# Patient Record
Sex: Female | Born: 1955 | Race: White | Hispanic: No | Marital: Married | Smoking: Never smoker
Health system: Southern US, Community
[De-identification: ages and names within clinical notes are randomized; demographics above are authoritative.]

## PROBLEM LIST (undated history)

## (undated) DIAGNOSIS — I10 Essential (primary) hypertension: Secondary | ICD-10-CM

## (undated) DIAGNOSIS — B029 Zoster without complications: Secondary | ICD-10-CM

## (undated) DIAGNOSIS — E039 Hypothyroidism, unspecified: Secondary | ICD-10-CM

## (undated) DIAGNOSIS — T7840XA Allergy, unspecified, initial encounter: Secondary | ICD-10-CM

## (undated) HISTORY — PX: APPENDECTOMY: SHX54

## (undated) HISTORY — DX: Hypothyroidism, unspecified: E03.9

## (undated) HISTORY — DX: Zoster without complications: B02.9

## (undated) HISTORY — DX: Allergy, unspecified, initial encounter: T78.40XA

## (undated) HISTORY — DX: Essential (primary) hypertension: I10

---

## 2008-06-15 HISTORY — PX: APPENDECTOMY: SHX54

## 2013-01-03 LAB — HM COLONOSCOPY

## 2013-11-13 DIAGNOSIS — B029 Zoster without complications: Secondary | ICD-10-CM

## 2013-11-13 HISTORY — DX: Zoster without complications: B02.9

## 2014-06-19 ENCOUNTER — Ambulatory Visit (INDEPENDENT_AMBULATORY_CARE_PROVIDER_SITE_OTHER): Payer: 59 | Admitting: Family Medicine

## 2014-06-19 ENCOUNTER — Encounter: Payer: Self-pay | Admitting: Family Medicine

## 2014-06-19 VITALS — BP 122/71 | HR 65 | Ht 69.0 in | Wt 167.0 lb

## 2014-06-19 DIAGNOSIS — E039 Hypothyroidism, unspecified: Secondary | ICD-10-CM | POA: Insufficient documentation

## 2014-06-19 DIAGNOSIS — E038 Other specified hypothyroidism: Secondary | ICD-10-CM

## 2014-06-19 DIAGNOSIS — N898 Other specified noninflammatory disorders of vagina: Secondary | ICD-10-CM

## 2014-06-19 HISTORY — DX: Hypothyroidism, unspecified: E03.9

## 2014-06-19 NOTE — Progress Notes (Signed)
CC: Danielle SchwartzKathryn Galvan is a 59 y.o. female is here for Establish Care   Subjective: HPI:  Very pleasant 59 year old here to establish care  Patient tells me that her OB/GYN in late 2015 did a random TSH and it was mildly elevated. Chart review shows that this was 7. On direct questioning she admits to long-standing cold intolerance, constipation and skin dryness. She denies any other skin or hair changes. She reports unintentional weight loss but admits it was over the holidays. Her aunt has a history of a thyroidectomy but she cannot give any other thyroid disease in the family. She's never worked around radiation nor does she report any dysphagia or swelling of the neck. TSH has not been repeated she was directed here by her  OB/GYN.  She tells me that she's been suffering from vaginal dryness and painful intercourse for at least one year now. She reports having sex approximately 3 times a year due to the discomfort. She's tried multiple over-the-counter lubricants and gels without much benefit. She was prescribed Premarin however was too expensive to get filled. She still has her uterus and has not had a period in 10 years. She tells me she would like to have sex more often if there was a treatment for her discomfort.  Review of Systems - General ROS: negative for - chills, fever, night sweats,  weight loss Ophthalmic ROS: negative for - decreased vision Psychological ROS: negative for - anxiety or depression ENT ROS: negative for - hearing change, nasal congestion, tinnitus or allergies Hematological and Lymphatic ROS: negative for - bleeding problems, bruising or swollen lymph nodes Breast ROS: negative Respiratory ROS: no cough, shortness of breath, or wheezing Cardiovascular ROS: no chest pain or dyspnea on exertion Gastrointestinal ROS: no abdominal pain, change in bowel habits, or black or bloody stools Genito-Urinary ROS: negative for - genital discharge, genital ulcers, incontinence or  abnormal bleeding from genitals Musculoskeletal ROS: negative for - joint pain or muscle pain Neurological ROS: negative for - headaches or memory loss Dermatological ROS: negative for lumps, mole changes, rash and skin lesion changes  Past Medical History  Diagnosis Date  . Shingles 11/2013    History reviewed. No pertinent past surgical history. Family History  Problem Relation Age of Onset  . Prostate cancer Father   . Dementia Father     History   Social History  . Marital Status: Married    Spouse Name: N/A    Number of Children: N/A  . Years of Education: N/A   Occupational History  . Not on file.   Social History Main Topics  . Smoking status: Never Smoker   . Smokeless tobacco: Not on file  . Alcohol Use: No  . Drug Use: No  . Sexual Activity:    Partners: Male   Other Topics Concern  . Not on file   Social History Narrative  . No narrative on file     Objective: BP 122/71 mmHg  Pulse 65  Ht 5\' 9"  (1.753 m)  Wt 167 lb (75.751 kg)  BMI 24.65 kg/m2  General: Alert and Oriented, No Acute Distress HEENT: Pupils equal, round, reactive to light. Conjunctivae clear.   Moist mucous membranes, pharynx without inflammation nor lesions.  Neck supple without palpable lymphadenopathy nor abnormal masses. Lungs: Clear to auscultation bilaterally, no wheezing/ronchi/rales.  Comfortable work of breathing. Good air movement. Cardiac: Regular rate and rhythm. Normal S1/S2.  No murmurs, rubs, nor gallops.   Extremities: No peripheral edema.  Strong peripheral  pulses.  Mental Status: No depression, anxiety, nor agitation. Skin: Warm and dry.  Assessment & Plan: Danielle Galvan was seen today for establish care.  Diagnoses and associated orders for this visit:  Subclinical hypothyroidism - TSH - T4, free - T3, free  Vaginal dryness    Subclinical hyperthyroidism: Rechecking TSH and free thyroid hormones. Ultimate plan of treatment will be based on any abnormality  above. Vaginal dryness: Will determine if hypothyroidism is contributing to this to any degree, if she continues to have hypothyroidism will only initiate treatment directed at that and see if TSH correction also helps with vaginal secretions. If the above is completely normal will offer Vagifem one tablet IV daily for two weeks followed by twice weekly.  Return if symptoms worsen or fail to improve.

## 2014-06-20 ENCOUNTER — Telehealth: Payer: Self-pay | Admitting: Family Medicine

## 2014-06-20 LAB — TSH: TSH: 4.996 u[IU]/mL — ABNORMAL HIGH (ref 0.350–4.500)

## 2014-06-20 LAB — T4, FREE: Free T4: 1.17 ng/dL (ref 0.80–1.80)

## 2014-06-20 LAB — T3, FREE: T3 FREE: 3 pg/mL (ref 2.3–4.2)

## 2014-06-20 MED ORDER — LEVOTHYROXINE SODIUM 25 MCG PO TABS: 25.0000 ug | ORAL_TABLET | Freq: Every day | ORAL | Status: DC

## 2014-06-20 NOTE — Telephone Encounter (Signed)
Left message on pt.'s vm.

## 2014-06-20 NOTE — Telephone Encounter (Signed)
Danielle Galvan, Will you please let patient know that her blood work confirms a persistent low thyroid function.  I'd recommend starting a thyroid supplementation regimen with levothyroxine that I've sent to her [facility].  I'd recommend she return in 1-2 months to look into improvement of many of the symptoms we discussed Tuesday.

## 2014-06-28 ENCOUNTER — Encounter: Payer: Self-pay | Admitting: Family Medicine

## 2014-06-28 DIAGNOSIS — Z299 Encounter for prophylactic measures, unspecified: Secondary | ICD-10-CM | POA: Insufficient documentation

## 2014-10-09 ENCOUNTER — Other Ambulatory Visit: Payer: Self-pay | Admitting: *Deleted

## 2014-10-09 ENCOUNTER — Ambulatory Visit: Payer: Self-pay | Admitting: Family Medicine

## 2014-10-09 MED ORDER — LEVOTHYROXINE SODIUM 25 MCG PO TABS: 25.0000 ug | ORAL_TABLET | Freq: Every day | ORAL | Status: DC

## 2014-10-17 ENCOUNTER — Encounter: Payer: Self-pay | Admitting: Family Medicine

## 2014-10-17 ENCOUNTER — Ambulatory Visit (INDEPENDENT_AMBULATORY_CARE_PROVIDER_SITE_OTHER): Payer: 59 | Admitting: Family Medicine

## 2014-10-17 VITALS — BP 131/77 | HR 74 | Ht 69.0 in | Wt 172.0 lb

## 2014-10-17 DIAGNOSIS — N898 Other specified noninflammatory disorders of vagina: Secondary | ICD-10-CM | POA: Diagnosis not present

## 2014-10-17 DIAGNOSIS — E039 Hypothyroidism, unspecified: Secondary | ICD-10-CM

## 2014-10-17 MED ORDER — ESTRADIOL 10 MCG VA TABS: ORAL_TABLET | VAGINAL | Status: DC

## 2014-10-17 NOTE — Progress Notes (Signed)
CC: Danielle SchwartzKathryn Galvan is a 59 y.o. female is here for Follow-up   Subjective: HPI:  Follow-up hypothyroidism: Continues to take 25 g of levothyroxine on a daily basis. She tells me that she's noticed improvements with no longer feeling colder than others, she is having restful sleep feels more energy in the morning, and his overall feels better since taking levothyroxine. She denies any known side effects. No gastrointestinal complaints skin or hair complaints.  She is still experiencing vaginal dryness which is interfering with her sexual relationship with her husband. She would like to have sex more often if he were so painful. She is unable to afford Premarin, she denies any other genitourinary complaints. The symptoms may going on for over a year not improved with new levothyroxine   Review Of Systems Outlined In HPI  Past Medical History  Diagnosis Date  . Shingles 11/2013    No past surgical history on file. Family History  Problem Relation Age of Onset  . Prostate cancer Father   . Dementia Father     History   Social History  . Marital Status: Married    Spouse Name: N/A  . Number of Children: N/A  . Years of Education: N/A   Occupational History  . Not on file.   Social History Main Topics  . Smoking status: Never Smoker   . Smokeless tobacco: Not on file  . Alcohol Use: No  . Drug Use: No  . Sexual Activity:    Partners: Male   Other Topics Concern  . Not on file   Social History Narrative     Objective: BP 131/77 mmHg  Pulse 74  Ht 5\' 9"  (1.753 m)  Wt 172 lb (78.019 kg)  BMI 25.39 kg/m2  Vital signs reviewed. General: Alert and Oriented, No Acute Distress HEENT: Pupils equal, round, reactive to light. Conjunctivae clear.  External ears unremarkable.  Moist mucous membranes. Lungs: Clear and comfortable work of breathing, speaking in full sentences without accessory muscle use. Cardiac: Regular rate and rhythm.  Neuro: CN II-XII grossly intact, gait  normal. Extremities: No peripheral edema.  Strong peripheral pulses.  Mental Status: No depression, anxiety, nor agitation. Logical though process. Skin: Warm and dry.  Assessment & Plan: Danielle DeistKathryn was seen today for follow-up.  Diagnoses and all orders for this visit:  Hypothyroidism, unspecified hypothyroidism type Orders: -     TSH  Vaginal dryness Orders: -     Estradiol (VAGIFEM) 10 MCG TABS vaginal tablet; one tablet intravaginally daily for two weeks followed by twice weekly   Hypothyroidism: Clinically great improvement checking TSH to ensure appropriate dosage of levothyroxine Vaginal dryness: Discussed trying Vagifem which may be much cheaper than Premarin that she is unable to afford    Return in about 3 months (around 01/17/2015).

## 2014-10-18 ENCOUNTER — Telehealth: Payer: Self-pay | Admitting: Family Medicine

## 2014-10-18 LAB — TSH: TSH: 3.917 u[IU]/mL (ref 0.350–4.500)

## 2014-10-18 MED ORDER — LEVOTHYROXINE SODIUM 25 MCG PO TABS: 25.0000 ug | ORAL_TABLET | Freq: Every day | ORAL | Status: DC

## 2014-10-18 NOTE — Telephone Encounter (Signed)
Left message on vm

## 2014-10-18 NOTE — Telephone Encounter (Signed)
Sue Lushndrea, Will you please let patient know that her thyroid supplementation appears to be adequate therefore I've send in refills of her levothyroxine to her [facility] in [place]. F/u in 3-6 months.

## 2015-01-18 ENCOUNTER — Ambulatory Visit: Payer: Self-pay | Admitting: Family Medicine

## 2015-02-20 ENCOUNTER — Ambulatory Visit (INDEPENDENT_AMBULATORY_CARE_PROVIDER_SITE_OTHER): Payer: Self-pay | Admitting: Family Medicine

## 2015-02-20 ENCOUNTER — Encounter: Payer: Self-pay | Admitting: Family Medicine

## 2015-02-20 VITALS — BP 110/68 | HR 69 | Wt 174.0 lb

## 2015-02-20 DIAGNOSIS — M5431 Sciatica, right side: Secondary | ICD-10-CM

## 2015-02-20 DIAGNOSIS — E039 Hypothyroidism, unspecified: Secondary | ICD-10-CM

## 2015-02-20 DIAGNOSIS — E038 Other specified hypothyroidism: Secondary | ICD-10-CM

## 2015-02-20 NOTE — Progress Notes (Signed)
CC: Danielle Galvan is a 59 y.o. female is here for f/u TSH   Subjective: HPI:  Follow-up hypothyroidism: She started to lose some hair on her scalp in June however started on a vitamin supplement with biotin and symptoms resided after a few days. She denies any hair loss elsewhere. There's been no unintentional weight gain or loss nor any skin complaints. No GI disturbance. Still takes levothyroxine on a daily basis.  Complains of a dull sensation in the back of her right buttock that if she is walking for long periods of time with nonsupportive footwear it will start to radiate down the back of the leg into the back of the heel. It is continuous. It will go away within a few minutes if she starts wearing supportive shoes or sits down. Pain is mild in severity but present most days of the week the last month or 2. Does not seem to be getting better or worse. She denies any weakness fasciculations or sensory disturbances in the legs other than that described above   Review Of Systems Outlined In HPI  Past Medical History  Diagnosis Date  . Shingles 11/2013    No past surgical history on file. Family History  Problem Relation Age of Onset  . Prostate cancer Father   . Dementia Father     Social History   Social History  . Marital Status: Married    Spouse Name: N/A  . Number of Children: N/A  . Years of Education: N/A   Occupational History  . Not on file.   Social History Main Topics  . Smoking status: Never Smoker   . Smokeless tobacco: Not on file  . Alcohol Use: No  . Drug Use: No  . Sexual Activity:    Partners: Male   Other Topics Concern  . Not on file   Social History Narrative     Objective: BP 110/68 mmHg  Pulse 69  Wt 174 lb (78.926 kg)  Vital signs reviewed. General: Alert and Oriented, No Acute Distress HEENT: Pupils equal, round, reactive to light. Conjunctivae clear.  External ears unremarkable.  Moist mucous membranes. Lungs: Clear and comfortable  work of breathing, speaking in full sentences without accessory muscle use. Cardiac: Regular rate and rhythm.  Neuro: CN II-XII grossly intact, gait normal. Extremities: No peripheral edema.  Strong peripheral pulses. Gait normal with full range of motion and strength of both lower extremities  Mental Status: No depression, anxiety, nor agitation. Logical though process. Skin: Warm and dry.  Assessment & Plan: Danielle Galvan was seen today for f/u tsh.  Diagnoses and all orders for this visit:  Subclinical hypothyroidism -     TSH  Sciatica, right   Subclinical hypothyroidism: Currently stable, she'll be due for a TSH in the next 1 or 2 months. Given her a lab slip to have this done at her convenience. Continue 25 MCG of levothyroxine pending results Right leg sciatica: Given a home rehabilitation exercise handouts to perform a daily basis for the next 2-3 weeks  Return if symptoms worsen or fail to improve.

## 2015-04-13 LAB — TSH: TSH: 6.371 u[IU]/mL — AB (ref 0.350–4.500)

## 2015-04-15 ENCOUNTER — Telehealth: Payer: Self-pay | Admitting: Family Medicine

## 2015-04-15 MED ORDER — LEVOTHYROXINE SODIUM 50 MCG PO TABS: 50.0000 ug | ORAL_TABLET | Freq: Every day | ORAL | Status: DC

## 2015-04-15 NOTE — Telephone Encounter (Signed)
awaiting call back

## 2015-04-15 NOTE — Telephone Encounter (Signed)
Will you please let patient know that her thyroid supplement appears to be under-dosed therefore I'd recommend increasing her supplement to a formulation that I've sent to her [facility].  I'd recommend f/u in 2 months to recheck this.

## 2015-04-23 ENCOUNTER — Ambulatory Visit (INDEPENDENT_AMBULATORY_CARE_PROVIDER_SITE_OTHER): Payer: BC Managed Care – PPO | Admitting: Family Medicine

## 2015-04-23 ENCOUNTER — Encounter: Payer: Self-pay | Admitting: Family Medicine

## 2015-04-23 VITALS — BP 112/69 | HR 73 | Wt 171.0 lb

## 2015-04-23 DIAGNOSIS — Z1231 Encounter for screening mammogram for malignant neoplasm of breast: Secondary | ICD-10-CM

## 2015-04-23 DIAGNOSIS — Z0000 Encounter for general adult medical examination without abnormal findings: Secondary | ICD-10-CM

## 2015-04-23 DIAGNOSIS — Z23 Encounter for immunization: Secondary | ICD-10-CM

## 2015-04-23 MED ORDER — CLOBETASOL PROP EMOLLIENT BASE 0.05 % EX CREA: TOPICAL_CREAM | CUTANEOUS | Status: DC

## 2015-04-23 MED ORDER — ZOSTER VACCINE LIVE 19400 UNT/0.65ML ~~LOC~~ SOLR: 0.6500 mL | Freq: Once | SUBCUTANEOUS | Status: DC

## 2015-04-23 NOTE — Addendum Note (Signed)
Addended by: Thom ChimesHENRY, Carle Fenech M on: 04/23/2015 04:20 PM   Modules accepted: Orders

## 2015-04-23 NOTE — Progress Notes (Signed)
CC: Danielle SchwartzKathryn Galvan is a 59 y.o. female is here for Annual Exam and Lab Results   Subjective: HPI:  Colonoscopy: Due for repeat 2018 Papsmear: three-year follow-up will be due next year Mammogram: it's been greater than 1 year since her last mammogram, orders have been placed  Influenza Vaccine: declines Pneumovax: no current indication Td/Tdap: 2005? Will receive booster today Zoster: urged to get prescription filled and financially affordable  Requesting complete physical exam with no acute complaints.  Review of Systems - General ROS: negative for - chills, fever, night sweats, weight gain or weight loss Ophthalmic ROS: negative for - decreased vision Psychological ROS: negative for - anxiety or depression ENT ROS: negative for - hearing change, nasal congestion, tinnitus or allergies Hematological and Lymphatic ROS: negative for - bleeding problems, bruising or swollen lymph nodes Breast ROS: negative Respiratory ROS: no cough, shortness of breath, or wheezing Cardiovascular ROS: no chest pain or dyspnea on exertion Gastrointestinal ROS: no abdominal pain, change in bowel habits, or black or bloody stools Genito-Urinary ROS: negative for - genital discharge, genital ulcers, incontinence or abnormal bleeding from genitals Musculoskeletal ROS: negative for - joint pain or muscle pain Neurological ROS: negative for - headaches or memory loss Dermatological ROS: negative for lumps, mole changes, rash and skin lesion changes  Past Medical History  Diagnosis Date  . Shingles 11/2013    No past surgical history on file. Family History  Problem Relation Age of Onset  . Prostate cancer Father   . Dementia Father     Social History   Social History  . Marital Status: Married    Spouse Name: N/A  . Number of Children: N/A  . Years of Education: N/A   Occupational History  . Not on file.   Social History Main Topics  . Smoking status: Never Smoker   . Smokeless tobacco: Not  on file  . Alcohol Use: No  . Drug Use: No  . Sexual Activity:    Partners: Male   Other Topics Concern  . Not on file   Social History Narrative     Objective: BP 112/69 mmHg  Pulse 73  Wt 171 lb (77.565 kg)  General: No Acute Distress HEENT: Atraumatic, normocephalic, conjunctivae normal without scleral icterus.  No nasal discharge, hearing grossly intact, TMs with good landmarks bilaterally with no middle ear abnormalities, posterior pharynx clear without oral lesions. Neck: Supple, trachea midline, no cervical nor supraclavicular adenopathy. Pulmonary: Clear to auscultation bilaterally without wheezing, rhonchi, nor rales. Cardiac: Regular rate and rhythm.  No murmurs, rubs, nor gallops. No peripheral edema.  2+ peripheral pulses bilaterally. Abdomen: Bowel sounds normal.  No masses.  Non-tender without rebound.  Negative Murphy's sign. MSK: Grossly intact, no signs of weakness.  Full strength throughout upper and lower extremities.  Full ROM in upper and lower extremities.  No midline spinal tenderness. Neuro: Gait unremarkable, CN II-XII grossly intact.  C5-C6 Reflex 2/4 Bilaterally, L4 Reflex 2/4 Bilaterally.  Cerebellar function intact. Skin: No rashes. Psych: Alert and oriented to person/place/time.  Thought process normal. No anxiety/depression.   Assessment & Plan: Danielle DeistKathryn was seen today for annual exam and lab results.  Diagnoses and all orders for this visit:  Annual physical exam -     zoster vaccine live, PF, (ZOSTAVAX) [id number] UNT/0.65ML injection; Inject 19,400 Units into the skin once. -     Lipid panel -     COMPLETE METABOLIC PANEL WITH GFR -     CBC  Encounter for screening  mammogram for breast cancer -     MM DIGITAL SCREENING BILATERAL; Future  Other orders -     Clobetasol Prop Emollient Base (CLOBETASOL PROPIONATE E) 0.05 % emollient cream; Apply to dermatitis twice a day as needed for up to two weeks.  Healthy lifestyle interventions including but  not limited to regular exercise, a healthy low fat diet, moderation of salt intake, the dangers of tobacco/alcohol/recreational drug use, nutrition supplementation, and accident avoidance were discussed with the patient and a handout was provided for future reference.  She brings in an expired tube of clobetasol that she uses for poison ivy, I'm giving her a prescription for fresh batch.  Return in about 3 months (around 07/24/2015) for Thyroid Check.

## 2015-04-24 ENCOUNTER — Ambulatory Visit (INDEPENDENT_AMBULATORY_CARE_PROVIDER_SITE_OTHER): Payer: BC Managed Care – PPO

## 2015-04-24 DIAGNOSIS — Z1231 Encounter for screening mammogram for malignant neoplasm of breast: Secondary | ICD-10-CM

## 2015-04-24 NOTE — Telephone Encounter (Signed)
Pt notified during office visit.

## 2015-04-26 LAB — COMPLETE METABOLIC PANEL WITH GFR
ALBUMIN: 4.2 g/dL (ref 3.6–5.1)
ALK PHOS: 62 U/L (ref 33–130)
ALT: 17 U/L (ref 6–29)
AST: 20 U/L (ref 10–35)
BILIRUBIN TOTAL: 0.6 mg/dL (ref 0.2–1.2)
BUN: 19 mg/dL (ref 7–25)
CALCIUM: 9.5 mg/dL (ref 8.6–10.4)
CO2: 28 mmol/L (ref 20–31)
Chloride: 101 mmol/L (ref 98–110)
Creat: 0.9 mg/dL (ref 0.50–0.99)
GFR, EST AFRICAN AMERICAN: 80 mL/min (ref >=60)
GFR, Est Non African American: 69 mL/min (ref >=60)
Glucose, Bld: 93 mg/dL (ref 65–99)
Potassium: 4.9 mmol/L (ref 3.5–5.3)
Sodium: 137 mmol/L (ref 135–146)
TOTAL PROTEIN: 6.9 g/dL (ref 6.1–8.1)

## 2015-04-26 LAB — CBC
HEMATOCRIT: 42.1 % (ref 36.0–46.0)
Hemoglobin: 14.1 g/dL (ref 12.0–15.0)
MCH: 29.4 pg (ref 26.0–34.0)
MCHC: 33.5 g/dL (ref 30.0–36.0)
MCV: 87.7 fL (ref 78.0–100.0)
MPV: 10.7 fL (ref 8.6–12.4)
Platelets: 255 10*3/uL (ref 150–400)
RBC: 4.8 MIL/uL (ref 3.87–5.11)
RDW: 13.5 % (ref 11.5–15.5)
WBC: 4.1 10*3/uL (ref 4.0–10.5)

## 2015-04-26 LAB — LIPID PANEL
CHOLESTEROL: 172 mg/dL (ref 125–200)
HDL: 75 mg/dL (ref >=46)
LDL Cholesterol: 85 mg/dL (ref <130)
TRIGLYCERIDES: 61 mg/dL (ref <150)
Total CHOL/HDL Ratio: 2.3 Ratio (ref <=5.0)
VLDL: 12 mg/dL (ref <30)

## 2015-08-13 ENCOUNTER — Ambulatory Visit (INDEPENDENT_AMBULATORY_CARE_PROVIDER_SITE_OTHER): Payer: BC Managed Care – PPO | Admitting: Family Medicine

## 2015-08-13 ENCOUNTER — Encounter: Payer: Self-pay | Admitting: Family Medicine

## 2015-08-13 VITALS — BP 114/74 | HR 67 | Wt 170.0 lb

## 2015-08-13 DIAGNOSIS — E039 Hypothyroidism, unspecified: Secondary | ICD-10-CM

## 2015-08-13 DIAGNOSIS — E038 Other specified hypothyroidism: Secondary | ICD-10-CM

## 2015-08-13 NOTE — Progress Notes (Signed)
CC: Danielle Galvan is a 60 y.o. female is here for Hypothyroidism   Subjective: HPI:  Follow-up hypothyroidism: She is taking levothyroxine50 MCG's without any noted side effects. She tells me that she feels great lately and she has no complaints. There's been no hair or skin complaints. She denies any unintentional weight loss or gain or any constipation or diarrhea.   Review Of Systems Outlined In HPI  Past Medical History  Diagnosis Date  . Shingles 11/2013    No past surgical history on file. Family History  Problem Relation Age of Onset  . Prostate cancer Father   . Dementia Father     Social History   Social History  . Marital Status: Married    Spouse Name: N/A  . Number of Children: N/A  . Years of Education: N/A   Occupational History  . Not on file.   Social History Main Topics  . Smoking status: Never Smoker   . Smokeless tobacco: Not on file  . Alcohol Use: No  . Drug Use: No  . Sexual Activity:    Partners: Male   Other Topics Concern  . Not on file   Social History Narrative     Objective: BP 114/74 mmHg  Pulse 67  Wt 170 lb (77.111 kg)  Vital signs reviewed. General: Alert and Oriented, No Acute Distress HEENT: Pupils equal, round, reactive to light. Conjunctivae clear.  External ears unremarkable.  Moist mucous membranes. Lungs: Clear and comfortable work of breathing, speaking in full sentences without accessory muscle use. Cardiac: Regular rate and rhythm.  Neuro: CN II-XII grossly intact, gait normal. Extremities: No peripheral edema.  Strong peripheral pulses.  Mental Status: No depression, anxiety, nor agitation. Logical though process. Skin: Warm and dry. Assessment & Plan: Danielle Galvan was seen today for hypothyroidism.  Diagnoses and all orders for this visit:  Subclinical hypothyroidism -     TSH   Asymptomatic subclinical hypothyroidism. Rechecking TSH, ultimate plan will be based on these results. If normal she can follow-up in  6 months   Return if symptoms worsen or fail to improve.

## 2015-08-14 ENCOUNTER — Telehealth: Payer: Self-pay | Admitting: Family Medicine

## 2015-08-14 LAB — TSH: TSH: 3.41 m[IU]/L

## 2015-08-14 MED ORDER — LEVOTHYROXINE SODIUM 50 MCG PO TABS: 50.0000 ug | ORAL_TABLET | Freq: Every day | ORAL | Status: DC

## 2015-08-14 NOTE — Telephone Encounter (Signed)
Will you please let patient know that her thyroid supplement appears to be adequately dosed.  I'll make sure she has refills sent to her pharmacy and would recommend rechecking this in six months.

## 2015-08-14 NOTE — Telephone Encounter (Signed)
Pt.notified

## 2016-04-20 ENCOUNTER — Telehealth: Payer: Self-pay

## 2016-04-20 MED ORDER — LEVOTHYROXINE SODIUM 50 MCG PO TABS: 50.0000 ug | ORAL_TABLET | Freq: Every day | ORAL | 0 refills | Status: DC

## 2016-04-20 NOTE — Telephone Encounter (Signed)
Patient has an upcoming appt on the 14th and she request a refill for Levothryoxine. Patient was advised that a 30 day supply be sent to her pharmacy and she understood she must keep her follow up appointment. Rhonda Cunningham,CMA

## 2016-04-28 ENCOUNTER — Ambulatory Visit (INDEPENDENT_AMBULATORY_CARE_PROVIDER_SITE_OTHER): Payer: BC Managed Care – PPO | Admitting: Osteopathic Medicine

## 2016-04-28 ENCOUNTER — Encounter: Payer: Self-pay | Admitting: Osteopathic Medicine

## 2016-04-28 VITALS — BP 125/67 | HR 66 | Ht 67.0 in | Wt 178.0 lb

## 2016-04-28 DIAGNOSIS — M25562 Pain in left knee: Secondary | ICD-10-CM

## 2016-04-28 DIAGNOSIS — N898 Other specified noninflammatory disorders of vagina: Secondary | ICD-10-CM

## 2016-04-28 DIAGNOSIS — Z0000 Encounter for general adult medical examination without abnormal findings: Secondary | ICD-10-CM | POA: Diagnosis not present

## 2016-04-28 DIAGNOSIS — L989 Disorder of the skin and subcutaneous tissue, unspecified: Secondary | ICD-10-CM | POA: Diagnosis not present

## 2016-04-28 DIAGNOSIS — E039 Hypothyroidism, unspecified: Secondary | ICD-10-CM | POA: Diagnosis not present

## 2016-04-28 NOTE — Patient Instructions (Addendum)
1500 calories per day (no less than 1450, no more than 2000)  Silicone based lubricant Call your insurance company and ask what is on the formulary for vaginal estrogen

## 2016-04-28 NOTE — Progress Notes (Signed)
HPI: Danielle Galvan is a 60 y.o. female  who presents to [facility] today, 04/28/16,  for chief complaint of:  Chief Complaint  Patient presents with  . Establish Care    switch from [place]    See below for review of preventive care.  Brief review of minor complaints as noted below.  SKIN: Spot on nose, not getting bigger, feels callused.   KNEES . Location: L knee  . Quality: Was having some pain in the left knee particularly with stairs, but this seems to have resolved . Context: Patient thinks it was due to getting out of her car and turning around too quickly, repeated stress, seemed to go away after she was spinning as a bit of time kneeling down to pain something in her house   Past medical, surgical, social and family history reviewed: Patient Active Problem List   Diagnosis Date Noted  . Preventive measure 06/28/2014  . Subclinical hypothyroidism 06/19/2014  . Vaginal dryness 06/19/2014   History reviewed. No pertinent surgical history.   Social History  Substance Use Topics  . Smoking status: Never Smoker  . Smokeless tobacco: Never Used  . Alcohol use No   Family History  Problem Relation Age of Onset  . Prostate cancer Father   . Dementia Father      Current medication list and allergy/intolerance information reviewed:   Current Outpatient Prescriptions  Medication Sig Dispense Refill  . Clobetasol Prop Emollient Base (CLOBETASOL PROPIONATE E) 0.05 % emollient cream Apply to dermatitis twice a day as needed for up to two weeks. 15 g 3  . levothyroxine (SYNTHROID) 50 MCG tablet Take 1 tablet (50 mcg total) by mouth daily before breakfast. Discontinue 42mg formulation. 30 tablet 0  . MAGNESIUM PO Take by mouth.    . Methylsulfonylmethane (MSM PO) Take by mouth.    .Marland KitchenVITAMIN D, CHOLECALCIFEROL, PO Take by mouth.    .Marland KitchenVITAMIN E PO Take by mouth.    . zoster vaccine live, PF, (ZOSTAVAX) 135701UNT/0.65ML injection Inject  19,400 Units into the skin once. 1 each 0   No current facility-administered medications for this visit.    Allergies  Allergen Reactions  . Ciprofloxacin   . Ivy Leaf [Hedera Helix]   . Sulfa Antibiotics Hives      Review of Systems:  Constitutional:  No  fever, no chills, No recent illness, No unintentional weight changes. No significant fatigue.   HEENT: No  headache, no vision change  Cardiac: No  chest pain, No  pressure, No palpitations, No  Orthopnea  Respiratory:  No  shortness of breath. No  Cough  Gastrointestinal: No  abdominal pain, No  nausea   Musculoskeletal: No new myalgia/arthralgia  Skin: No  Rash, +other wounds/concerning lesions  Hem/Onc: No  easy bruising/bleeding, No  abnormal lymph node  Neurologic: No  weakness, No  dizzines  Psychiatric: No  concerns with depression, No  concerns with anxiety, No sleep problems, No mood problems  Exam:  BP 125/67   Pulse 66   Ht _0  (1.702 m)   Wt 178 lb (80.7 kg)   BMI 27.88 kg/m   Constitutional: VS see above. General Appearance: alert, well-developed, well-nourished, NAD  Eyes: Normal lids and conjunctive, non-icteric sclera  Ears, Nose, Mouth, Throat: MMM, Normal external inspection ears/nares/mouth/lips/gums. TM normal bilaterally. Pharynx/tonsils no erythema, no exudate. Nasal mucosa normal.   Neck: No masses, trachea midline. No thyroid enlargement. No tenderness/mass appreciated. No lymphadenopathy  Respiratory:  Normal respiratory effort. no wheeze, no rhonchi, no rales  Cardiovascular: S1/S2 normal, no murmur, no rub/gallop auscultated. RRR. No lower extremity edema. Pedal pulse II/IV bilaterally DP and PT. No carotid bruit or JVD. No abdominal aortic bruit.  Gastrointestinal: Nontender, no masses. No hepatomegaly, no splenomegaly. No hernia appreciated. Bowel sounds normal. Rectal exam deferred.   Musculoskeletal: Gait normal. No clubbing/cyanosis of digits. L knee: Negative  anterior/posterior drawer, negative McMurray's to internal and sternal rotation, negative varus/valgus stress, no crepitus.  Neurological: Normal balance/coordination. No tremor. No cranial nerve deficit on limited exam. Motor and sensation intact and symmetric. Cerebellar reflexes intact.   Skin: warm, dry, intact. No rash/ulcer. No concerning nevi or subq nodules on limited exam.    Psychiatric: Normal judgment/insight. Normal mood and affect. Oriented x3.    Results for orders placed or performed in visit on 04/28/16 (from the past 72 hour(s))  CBC with Differential/Platelet     Status: None   Collection Time: 04/28/16  4:45 PM  Result Value Ref Range   WBC 4.7 3.8 - 10.8 K/uL   RBC 4.74 3.80 - 5.10 MIL/uL   Hemoglobin 13.9 11.7 - 15.5 g/dL   HCT 41.6 35.0 - 45.0 %   MCV 87.8 80.0 - 100.0 fL   MCH 29.3 27.0 - 33.0 pg   MCHC 33.4 32.0 - 36.0 g/dL   RDW 13.6 11.0 - 15.0 %   Platelets 262 140 - 400 K/uL   MPV 10.8 7.5 - 12.5 fL   Neutro Abs 2,444 1,500 - 7,800 cells/uL   Lymphs Abs 1,692 850 - 3,900 cells/uL   Monocytes Absolute 470 200 - 950 cells/uL   Eosinophils Absolute 94 15 - 500 cells/uL   Basophils Absolute 0 0 - 200 cells/uL   Neutrophils Relative % 52 %   Lymphocytes Relative 36 %   Monocytes Relative 10 %   Eosinophils Relative 2 %   Basophils Relative 0 %   Smear Review Criteria for review not met   COMPLETE METABOLIC PANEL WITH GFR     Status: None   Collection Time: 04/28/16  4:45 PM  Result Value Ref Range   Sodium 137 135 - 146 mmol/L   Potassium 4.4 3.5 - 5.3 mmol/L   Chloride 101 98 - 110 mmol/L   CO2 29 20 - 31 mmol/L   Glucose, Bld 91 65 - 99 mg/dL   BUN 20 7 - 25 mg/dL   Creat 0.96 0.50 - 0.99 mg/dL    Comment:   For patients > or = 62 years of age: The upper reference limit for Creatinine is approximately 13% higher for people identified as African-American.      Total Bilirubin 0.4 0.2 - 1.2 mg/dL   Alkaline Phosphatase 59 33 - 130 U/L   AST  21 10 - 35 U/L   ALT 18 6 - 29 U/L   Total Protein 7.4 6.1 - 8.1 g/dL   Albumin 4.5 3.6 - 5.1 g/dL   Calcium 9.3 8.6 - 10.4 mg/dL   GFR, Est African American 73 >=60 mL/min   GFR, Est Non African American 64 >=60 mL/min  Lipid panel     Status: Abnormal   Collection Time: 04/28/16  4:45 PM  Result Value Ref Range   Cholesterol 195 <200 mg/dL    Comment: ** Please note change in reference range(s). **      Triglycerides 84 <150 mg/dL    Comment: ** Please note change in reference range(s). **  HDL 74 >50 mg/dL    Comment: ** Please note change in reference range(s). **      Total CHOL/HDL Ratio 2.6 <5.0 Ratio   VLDL 17 <30 mg/dL   LDL Cholesterol 104 (H) <100 mg/dL    Comment: ** Please note change in reference range(s). **     TSH     Status: Abnormal   Collection Time: 04/28/16  4:45 PM  Result Value Ref Range   TSH 5.99 (H) mIU/L    Comment:   Reference Range   > or = 20 Years  0.40-4.50   Pregnancy Range First trimester  0.26-2.66 Second trimester 0.55-2.73 Third trimester  0.43-2.91         ASSESSMENT/PLAN:   Annual physical exam - Plan: CBC with Differential/Platelet, COMPLETE METABOLIC PANEL WITH GFR, Lipid panel, TSH  Hypothyroidism, unspecified type  Left knee pain, unspecified chronicity - Seems to have resolved, patient is more curious about why it would have gone away on its own. I advised if it recurs, let me know  Benign skin lesion of nose - Appears to be actinic change but I would not feel comfortable biopsying this area or freezing it without confirming wit, advise follow-up with her dermatologist  Vaginal dryness    Patient Instructions  1500 calories per day (no less than 1450, no more than 2355)  Silicone based lubricant Call your insurance company and ask what is on the formulary for vaginal estrogen        FEMALE PREVENTIVE CARE Updated 04/28/16   ANNUAL SCREENING/COUNSELING  Diet/Exercise - HEALTHY HABITS DISCUSSED  TO DECREASE CV RISK History  Smoking Status  . Never Smoker  Smokeless Tobacco  . Never Used   History  Alcohol Use No    No flowsheet data found.  Domestic violence concerns - no  HTN SCREENING - SEE [place]  Sexually active in the past year - Yes with female.  Need/want STI testing today? - no  Concerns about libido or pain with sex? - yes - pain/dryness  INFECTIOUS DISEASE SCREENING  HIV - does not need  GC/CT - does not need  HepC - DOB [birth year] - does not need  TB - does not need  DISEASE SCREENING  Lipid - needs  DM2 - needs  Osteoporosis - women age 18+ - does not need  CANCER SCREENING  Cervical - does not need - following with OBGYN   Breast - does not need - every other year   Lung - does not need  Colon - does not need - not due until 2018   ADULT VACCINATION  Influenza - annual vaccine recommended  Td - booster every 10 years   Zoster - option at 50, yes at 60+   PCV13 - was not indicated  PPSV23 - was not indicated Immunization History  Administered Date(s) Administered  . Hepatitis B 04/22/2012, 08/17/2013  . Influenza-Unspecified 04/23/2015  . MMR 02/02/1994  . Td 01/17/2004  . Tdap 04/23/2015    OTHER  Fall - exercise and Vit D age 28+ - does not need  Consider ASA - age 61-59 - does not need     Visit summary with medication list and pertinent instructions was printed for patient to review. All questions at time of visit were answered - patient instructed to contact office with any additional concerns. ER/RTC precautions were reviewed with the patient. Follow-up plan: Return in about 1 year (around 04/28/2017) for [place] sooner if needed .

## 2016-04-29 LAB — COMPLETE METABOLIC PANEL WITH GFR
ALT: 18 U/L (ref 6–29)
AST: 21 U/L (ref 10–35)
Albumin: 4.5 g/dL (ref 3.6–5.1)
Alkaline Phosphatase: 59 U/L (ref 33–130)
BILIRUBIN TOTAL: 0.4 mg/dL (ref 0.2–1.2)
BUN: 20 mg/dL (ref 7–25)
CHLORIDE: 101 mmol/L (ref 98–110)
CO2: 29 mmol/L (ref 20–31)
Calcium: 9.3 mg/dL (ref 8.6–10.4)
Creat: 0.96 mg/dL (ref 0.50–0.99)
GFR, EST AFRICAN AMERICAN: 73 mL/min (ref >=60)
GFR, EST NON AFRICAN AMERICAN: 64 mL/min (ref >=60)
GLUCOSE: 91 mg/dL (ref 65–99)
POTASSIUM: 4.4 mmol/L (ref 3.5–5.3)
SODIUM: 137 mmol/L (ref 135–146)
TOTAL PROTEIN: 7.4 g/dL (ref 6.1–8.1)

## 2016-04-29 LAB — CBC WITH DIFFERENTIAL/PLATELET
BASOS ABS: 0 {cells}/uL (ref 0–200)
Basophils Relative: 0 %
EOS ABS: 94 {cells}/uL (ref 15–500)
EOS PCT: 2 %
HCT: 41.6 % (ref 35.0–45.0)
Hemoglobin: 13.9 g/dL (ref 11.7–15.5)
LYMPHS PCT: 36 %
Lymphs Abs: 1692 cells/uL (ref 850–3900)
MCH: 29.3 pg (ref 27.0–33.0)
MCHC: 33.4 g/dL (ref 32.0–36.0)
MCV: 87.8 fL (ref 80.0–100.0)
MPV: 10.8 fL (ref 7.5–12.5)
Monocytes Absolute: 470 cells/uL (ref 200–950)
Monocytes Relative: 10 %
NEUTROS ABS: 2444 {cells}/uL (ref 1500–7800)
NEUTROS PCT: 52 %
Platelets: 262 10*3/uL (ref 140–400)
RBC: 4.74 MIL/uL (ref 3.80–5.10)
RDW: 13.6 % (ref 11.0–15.0)
WBC: 4.7 10*3/uL (ref 3.8–10.8)

## 2016-04-29 LAB — LIPID PANEL
CHOL/HDL RATIO: 2.6 ratio (ref <5.0)
Cholesterol: 195 mg/dL (ref <200)
HDL: 74 mg/dL (ref >50)
LDL CALC: 104 mg/dL — AB (ref <100)
TRIGLYCERIDES: 84 mg/dL (ref <150)
VLDL: 17 mg/dL (ref <30)

## 2016-04-29 LAB — TSH: TSH: 5.99 mIU/L — ABNORMAL HIGH

## 2016-04-30 DIAGNOSIS — M25562 Pain in left knee: Secondary | ICD-10-CM | POA: Insufficient documentation

## 2016-04-30 DIAGNOSIS — L989 Disorder of the skin and subcutaneous tissue, unspecified: Secondary | ICD-10-CM | POA: Insufficient documentation

## 2016-04-30 MED ORDER — LEVOTHYROXINE SODIUM 75 MCG PO TABS: 75.0000 ug | ORAL_TABLET | Freq: Every day | ORAL | 0 refills | Status: DC

## 2016-04-30 NOTE — Addendum Note (Signed)
Addended by: Deirdre PippinsALEXANDER, Gavriela Cashin M on: 04/30/2016 11:47 AM   Modules accepted: Orders

## 2016-05-22 ENCOUNTER — Other Ambulatory Visit: Payer: Self-pay | Admitting: *Deleted

## 2016-05-22 ENCOUNTER — Other Ambulatory Visit: Payer: Self-pay

## 2016-05-22 MED ORDER — LEVOTHYROXINE SODIUM 75 MCG PO TABS: 75.0000 ug | ORAL_TABLET | Freq: Every day | ORAL | 0 refills | Status: DC

## 2016-07-15 ENCOUNTER — Encounter: Payer: Self-pay | Admitting: Osteopathic Medicine

## 2016-07-15 ENCOUNTER — Ambulatory Visit (INDEPENDENT_AMBULATORY_CARE_PROVIDER_SITE_OTHER): Payer: BC Managed Care – PPO | Admitting: Osteopathic Medicine

## 2016-07-15 VITALS — BP 114/66 | HR 68 | Ht 66.75 in | Wt 177.1 lb

## 2016-07-15 DIAGNOSIS — Z23 Encounter for immunization: Secondary | ICD-10-CM

## 2016-07-15 DIAGNOSIS — E039 Hypothyroidism, unspecified: Secondary | ICD-10-CM

## 2016-07-15 LAB — TSH: TSH: 1.37 m[IU]/L

## 2016-07-15 MED ORDER — ZOSTER VAC RECOMB ADJUVANTED 50 MCG/0.5ML IM SUSR: 0.5000 mL | Freq: Once | INTRAMUSCULAR | 1 refills | Status: AC

## 2016-07-15 NOTE — Progress Notes (Signed)
Patient did not need follow-up with me, needed to go to the lab, appointment was made in error. She did get flu shot so will charge nurse visit - see nurse notes. Rx for shingles vaccine was sent to her pharmacy.

## 2016-07-16 ENCOUNTER — Other Ambulatory Visit: Payer: Self-pay | Admitting: Osteopathic Medicine

## 2016-07-16 MED ORDER — LEVOTHYROXINE SODIUM 75 MCG PO TABS: 75.0000 ug | ORAL_TABLET | Freq: Every day | ORAL | 3 refills | Status: DC

## 2016-09-21 ENCOUNTER — Ambulatory Visit (INDEPENDENT_AMBULATORY_CARE_PROVIDER_SITE_OTHER): Payer: BC Managed Care – PPO | Admitting: Podiatry

## 2016-09-21 ENCOUNTER — Encounter: Payer: Self-pay | Admitting: Podiatry

## 2016-09-21 VITALS — BP 113/94 | HR 72 | Ht 69.0 in | Wt 179.0 lb

## 2016-09-21 DIAGNOSIS — M216X2 Other acquired deformities of left foot: Secondary | ICD-10-CM

## 2016-09-21 DIAGNOSIS — M2012 Hallux valgus (acquired), left foot: Secondary | ICD-10-CM

## 2016-09-21 DIAGNOSIS — M2011 Hallux valgus (acquired), right foot: Secondary | ICD-10-CM | POA: Diagnosis not present

## 2016-09-21 DIAGNOSIS — M79672 Pain in left foot: Secondary | ICD-10-CM

## 2016-09-21 DIAGNOSIS — M79671 Pain in right foot: Secondary | ICD-10-CM

## 2016-09-21 DIAGNOSIS — M216X1 Other acquired deformities of right foot: Secondary | ICD-10-CM | POA: Diagnosis not present

## 2016-09-21 DIAGNOSIS — M21619 Bunion of unspecified foot: Secondary | ICD-10-CM

## 2016-09-21 DIAGNOSIS — M21969 Unspecified acquired deformity of unspecified lower leg: Secondary | ICD-10-CM

## 2016-09-21 NOTE — Patient Instructions (Signed)
Seen for painful bunions. Noted of weak first metatarsal bone with enlarged bunions. Both feet casted for orthotics. Calluses trimmed on both great toes. Will call when orthotics are ready.

## 2016-09-21 NOTE — Progress Notes (Signed)
SUBJECTIVE: 61 y.o. year old female presents complaining of painful bunion R>L. 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  REVIEW OF SYSTEMS: Pertinent items noted in HPI and remainder of comprehensive ROS otherwise negative.  OBJECTIVE: DERMATOLOGIC EXAMINATION: Nails: normal appearing nails bilaterally Calluses: Pinch calluses on both great toe plantar medial.   VASCULAR EXAMINATION OF LOWER LIMBS: All pedal pulses are palpable with normal pulsation.  Capillary Filling times within 3 seconds in all digits.  No edema or erythema noted. Temperature gradient from tibial crest to dorsum of foot is within normal bilateral.  NEUROLOGIC EXAMINATION OF THE LOWER LIMBS: All epicritic and tactile sensations grossly intact. Sharp and Dull discriminatory sensations at the plantar ball of hallux is intact bilateral.   MUSCULOSKELETAL EXAMINATION: Positive for hypermobile first ray bilateral. Enlarged bunion L>R, more pain in right bunion.  ASSESSMENT: Painful bunion bilateral. Hypermobile first ray bilateral. Pinch callus both great toes.  PLAN: Reviewed clinical findings and available treatment options, NSAIA, injection, orthotics, surgery. Both feet casted for Orthotics.

## 2016-10-09 ENCOUNTER — Ambulatory Visit: Payer: BC Managed Care – PPO | Admitting: Osteopathic Medicine

## 2016-11-11 ENCOUNTER — Encounter: Payer: Self-pay | Admitting: Podiatry

## 2016-11-11 ENCOUNTER — Ambulatory Visit (INDEPENDENT_AMBULATORY_CARE_PROVIDER_SITE_OTHER): Payer: BC Managed Care – PPO | Admitting: Podiatry

## 2016-11-11 DIAGNOSIS — M21619 Bunion of unspecified foot: Secondary | ICD-10-CM

## 2016-11-11 DIAGNOSIS — M79671 Pain in right foot: Secondary | ICD-10-CM

## 2016-11-11 DIAGNOSIS — M21969 Unspecified acquired deformity of unspecified lower leg: Secondary | ICD-10-CM

## 2016-11-11 DIAGNOSIS — M79672 Pain in left foot: Secondary | ICD-10-CM

## 2016-11-11 NOTE — Progress Notes (Signed)
SUBJECTIVE: 61 y.o. year old female presents for follow up on orthotics. Feet are doing better with orthotics. Able to do more activities. Wearing well supported tennis shoes.  HPI: Painful bunion R>L. 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  OBJECTIVE: No changes since last visit.  ASSESSMENT: Improved foot pain with custom orthotics.  Painful bunion bilateral. Hypermobile first ray bilateral. Pinch callus both great toes.  PLAN: Reviewed shoe selection.  Return as needed.

## 2016-11-11 NOTE — Patient Instructions (Signed)
Orthotic follow up. Doing well. Return as needed. 

## 2017-02-18 ENCOUNTER — Encounter: Payer: Self-pay | Admitting: Osteopathic Medicine

## 2017-02-24 ENCOUNTER — Encounter: Payer: Self-pay | Admitting: Osteopathic Medicine

## 2017-02-24 ENCOUNTER — Ambulatory Visit (INDEPENDENT_AMBULATORY_CARE_PROVIDER_SITE_OTHER): Payer: BC Managed Care – PPO | Admitting: Osteopathic Medicine

## 2017-02-24 ENCOUNTER — Other Ambulatory Visit (HOSPITAL_COMMUNITY)
Admission: RE | Admit: 2017-02-24 | Discharge: 2017-02-24 | Disposition: A | Discharge status: Home / Self Care | Payer: BC Managed Care – PPO | Source: Ambulatory Visit | Attending: Osteopathic Medicine | Admitting: Osteopathic Medicine

## 2017-02-24 VITALS — BP 129/68 | HR 67 | Ht 69.0 in | Wt 182.0 lb

## 2017-02-24 DIAGNOSIS — Z0000 Encounter for general adult medical examination without abnormal findings: Secondary | ICD-10-CM | POA: Diagnosis not present

## 2017-02-24 DIAGNOSIS — E039 Hypothyroidism, unspecified: Secondary | ICD-10-CM | POA: Diagnosis not present

## 2017-02-24 DIAGNOSIS — D72819 Decreased white blood cell count, unspecified: Secondary | ICD-10-CM | POA: Diagnosis not present

## 2017-02-24 DIAGNOSIS — R7989 Other specified abnormal findings of blood chemistry: Secondary | ICD-10-CM | POA: Diagnosis not present

## 2017-02-24 NOTE — Progress Notes (Addendum)
HPI: Danielle Galvan is a 61 y.o. female  who presents to [facility] today, 02/24/17,  for chief complaint of:  Chief Complaint  Patient presents with  . Annual Exam    See below for review of preventive care. Doing well since last visit.   Recently seen at dermatology for destruction of benign skin lesions.   Vaginal dryness but it doesn't bother her too much.     Past medical, surgical, social and family history reviewed: Patient Active Problem List   Diagnosis Date Noted  . Left knee pain 04/30/2016  . Benign skin lesion of nose 04/30/2016  . Preventive measure 06/28/2014  . Subclinical hypothyroidism 06/19/2014  . Vaginal dryness 06/19/2014   No past surgical history on file.   Social History  Substance Use Topics  . Smoking status: Never Smoker  . Smokeless tobacco: Never Used  . Alcohol use No   Family History  Problem Relation Age of Onset  . Prostate cancer Father   . Dementia Father      Current medication list and allergy/intolerance information reviewed:   Current Outpatient Prescriptions  Medication Sig Dispense Refill  . Clobetasol Prop Emollient Base (CLOBETASOL PROPIONATE E) 0.05 % emollient cream Apply to dermatitis twice a day as needed for up to two weeks. 15 g 3  . levothyroxine (SYNTHROID, LEVOTHROID) 75 MCG tablet Take 1 tablet (75 mcg total) by mouth daily before breakfast. 90 tablet 3  . zoster vaccine live, PF, (ZOSTAVAX) 16109 UNT/0.65ML injection Inject 19,400 Units into the skin once. 1 each 0   No current facility-administered medications for this visit.    Allergies  Allergen Reactions  . Ciprofloxacin   . Ivy Leaf [Hedera Helix]   . Sulfa Antibiotics Hives      Review of Systems:  Constitutional:  No  fever, no chills, No recent illness, No unintentional weight changes. No significant fatigue.   HEENT: No  headache, no vision change  Cardiac: No  chest pain, No  pressure, No palpitations, No   Orthopnea  Respiratory:  No  shortness of breath. No  Cough  Gastrointestinal: No  abdominal pain, No  nausea   Musculoskeletal: No new myalgia/arthralgia  Skin: No  Rash, No other wounds/concerning lesions  Hem/Onc: No  easy bruising/bleeding, No  abnormal lymph node  Neurologic: No  weakness, No  dizzines  Psychiatric: No  concerns with depression, No  concerns with anxiety, No sleep problems, No mood problems  Exam:  BP 129/68   Pulse 67   Ht 5' 9" (1.753 m)   Wt 182 lb (82.6 kg)   BMI 26.88 kg/m   Constitutional: VS see above. General Appearance: alert, well-developed, well-nourished, NAD  Eyes: Normal lids and conjunctive, non-icteric sclera  Ears, Nose, Mouth, Throat: MMM, Normal external inspection ears/nares/mouth/lips/gums. TM normal bilaterally. Pharynx/tonsils no erythema, no exudate. Nasal mucosa normal.   Neck: No masses, trachea midline. No thyroid enlargement. No tenderness/mass appreciated. No lymphadenopathy  Respiratory: Normal respiratory effort. no wheeze, no rhonchi, no rales  Cardiovascular: S1/S2 normal, no murmur, no rub/gallop auscultated. RRR. No lower extremity edema.  Gastrointestinal: Nontender, no masses. No hepatomegaly, no splenomegaly. No hernia appreciated. Bowel sounds normal. Rectal exam deferred.   Musculoskeletal: Gait normal. No clubbing/cyanosis of digits. L knee: Negative anterior/posterior drawer, negative McMurray's to internal and sternal rotation, negative varus/valgus stress, no crepitus.  Neurological: Normal balance/coordination. No tremor.   Skin: warm, dry, intact.   Psychiatric: Normal judgment/insight. Normal mood and affect. Oriented x3.  GYN: No lesions/ulcers to external genitalia, normal urethra, atrophic vaginal mucosa, physiologic discharge, cervix normal without lesions, uterus not enlarged or tender, adnexa no masses and nontender  BREAST: No rashes/skin changes, normal fibrous breast tissue, no masses or  tenderness, normal nipple without discharge, normal axilla     ASSESSMENT/PLAN:   Annual physical exam - Plan: Cytology - PAP, CBC, COMPLETE METABOLIC PANEL WITH GFR, Lipid panel, TSH, VITAMIN D 25 Hydroxy (Vit-D Deficiency, Fractures)  Hypothyroidism, unspecified type - Plan: TSH, VITAMIN D 25 Hydroxy (Vit-D Deficiency, Fractures)     FEMALE PREVENTIVE CARE Updated 02/24/17   ANNUAL SCREENING/COUNSELING  Diet/Exercise - HEALTHY HABITS DISCUSSED TO DECREASE CV RISK History  Smoking Status  . Never Smoker  Smokeless Tobacco  . Never Used   History  Alcohol Use No   Depression screen PHQ 2/9 02/24/2017  Decreased Interest 0  Down, Depressed, Hopeless 0  PHQ - 2 Score 0    Domestic violence concerns - no  HTN SCREENING - SEE [place]  Sexually active in the past year - Yes with female.  Need/want STI testing today? - no  Concerns about libido or pain with sex? - yes - pain/dryness  INFECTIOUS DISEASE SCREENING  HIV - does not need - low risk  GC/CT - does not need  HepC - DOB 1947-1965 - low risk   TB - does not need  DISEASE SCREENING  Lipid - needs  DM2 - needs  Osteoporosis - women age 76+ - does not need  CANCER SCREENING  Cervical - does not need - following with OBGYN   Breast - does not need - every other year   Lung - does not need  Colon - does not need - not due until 2019 she states was 5 years ago, no records available   ADULT VACCINATION  Influenza - annual vaccine recommended - declined  Td - booster every 10 years   Zoster - option at 56, yes at 60+   PCV13 - was not indicated  PPSV23 - was not indicated Immunization History  Administered Date(s) Administered  . Hepatitis B 04/22/2012, 08/17/2013  . Influenza-Unspecified 04/23/2015, 07/15/2016  . MMR 02/02/1994  . Td 01/17/2004  . Tdap 04/23/2015        Visit summary with medication list and pertinent instructions was printed for patient to review.  All questions at time of visit were answered - patient instructed to contact office with any additional concerns. ER/RTC precautions were reviewed with the patient. Follow-up plan: Return in about 1 year (around 02/24/2018) for [place] .      ADDENDUM 03/03/17   Recent Results (from the past 2160 hour(s))  Cytology - PAP     Status: None   Collection Time: 02/24/17 12:00 AM  Result Value Ref Range   Adequacy      Satisfactory for evaluation  endocervical/transformation zone component PRESENT.   Diagnosis      NEGATIVE FOR INTRAEPITHELIAL LESIONS OR MALIGNANCY.   HPV NOT DETECTED     Comment: Normal Reference Range - NOT Detected   Material Submitted CervicoVaginal Pap [ThinPrep Imaged]   CBC     Status: Abnormal   Collection Time: 02/25/17  9:19 AM  Result Value Ref Range   WBC 3.1 (L) 3.8 - 10.8 Thousand/uL   RBC 4.81 3.80 - 5.10 Million/uL   Hemoglobin 14.2 11.7 - 15.5 g/dL   HCT 42.2 35.0 - 45.0 %   MCV 87.7 80.0 - 100.0 fL  MCH 29.5 27.0 - 33.0 pg   MCHC 33.6 32.0 - 36.0 g/dL   RDW 12.7 11.0 - 15.0 %   Platelets 238 140 - 400 Thousand/uL   MPV 11.2 7.5 - 12.5 fL  COMPLETE METABOLIC PANEL WITH GFR     Status: Abnormal   Collection Time: 02/25/17  9:19 AM  Result Value Ref Range   Glucose, Bld 98 65 - 99 mg/dL    Comment: .            Fasting reference interval .    BUN 20 7 - 25 mg/dL   Creat 1.09 (H) 0.50 - 0.99 mg/dL    Comment: For patients >47 years of age, the reference limit for Creatinine is approximately 13% higher for people identified as African-American. .    GFR, Est Non African American 54 (L) > OR = 60 mL/min/1.62m   GFR, Est African American 63 > OR = 60 mL/min/1.741m  BUN/Creatinine Ratio 18 6 - 22 (calc)   Sodium 138 135 - 146 mmol/L   Potassium 4.6 3.5 - 5.3 mmol/L   Chloride 103 98 - 110 mmol/L   CO2 27 20 - 32 mmol/L   Calcium 9.3 8.6 - 10.4 mg/dL   Total Protein 7.0 6.1 - 8.1 g/dL   Albumin 4.5 3.6 - 5.1 g/dL   Globulin 2.5 1.9  - 3.7 g/dL (calc)   AG Ratio 1.8 1.0 - 2.5 (calc)   Total Bilirubin 0.6 0.2 - 1.2 mg/dL   Alkaline phosphatase (APISO) 67 33 - 130 U/L   AST 16 10 - 35 U/L   ALT 14 6 - 29 U/L  Lipid panel     Status: None   Collection Time: 02/25/17  9:19 AM  Result Value Ref Range   Cholesterol 186 <200 mg/dL   HDL 75 >50 mg/dL   Triglycerides 62 <150 mg/dL   LDL Cholesterol (Calc) 97 mg/dL (calc)    Comment: Reference range: <100 . Desirable range <100 mg/dL for primary prevention;   <70 mg/dL for patients with CHD or diabetic patients  with > or = 2 CHD risk factors. . Marland KitchenDL-C is now calculated using the Martin-Hopkins  calculation, which is a validated novel method providing  better accuracy than the Friedewald equation in the  estimation of LDL-C.  MaCresenciano Genret al. JAAnnamaria [id number];[id number]  (http://education.[facility].com/faq/FAQ164)    Total CHOL/HDL Ratio 2.5 <5.0 (calc)   Non-HDL Cholesterol (Calc) 111 <130 mg/dL (calc)    Comment: For patients with diabetes plus 1 major ASCVD risk  factor, treating to a non-HDL-C goal of <100 mg/dL  (LDL-C of <70 mg/dL) is considered a therapeutic  option.   TSH     Status: None   Collection Time: 02/25/17  9:19 AM  Result Value Ref Range   TSH 1.31 0.40 - 4.50 mIU/L  VITAMIN D 25 Hydroxy (Vit-D Deficiency, Fractures)     Status: None   Collection Time: 02/25/17  9:19 AM  Result Value Ref Range   Vit D, 25-Hydroxy 43 30 - 100 ng/mL    Comment: Vitamin D Status         25-OH Vitamin D: . Deficiency:                    <20 ng/mL Insufficiency:             20 - 29 ng/mL Optimal:                 >  or = 30 ng/mL . For 25-OH Vitamin D testing on patients on  D2-supplementation and patients for whom quantitation  of D2 and D3 fractions is required, the [facility]ureD(TM) 25-OH VIT D, (D2,D3), LC/MS/MS is recommended: order  code [phone] (patients >87yr). . For more information on this test, go  to: http://education.[facility].com/faq/FAQ163 (This link is being provided for  informational/educational purposes only.)    Annual physical exam - Plan: Cytology - PAP, CBC, COMPLETE METABOLIC PANEL WITH GFR, Lipid panel, TSH, VITAMIN D 25 Hydroxy (Vit-D Deficiency, Fractures)  Hypothyroidism, unspecified type - Plan: TSH, VITAMIN D 25 Hydroxy (Vit-D Deficiency, Fractures)  Elevated serum creatinine - Plan: BASIC METABOLIC PANEL WITH GFR  Leukopenia, unspecified type - Plan: CBC with Differential/Platelet  Will follow up on WBC and Cr 2-4 weeks

## 2017-02-24 NOTE — Patient Instructions (Signed)
Preventive Care 40-64 Years, Female Preventive care refers to lifestyle choices and visits with your health care provider that can promote health and wellness. What does preventive care include?  A yearly physical exam. This is also called an annual well check.  Dental exams once or twice a year.  Routine eye exams. Ask your health care provider how often you should have your eyes checked.  Personal lifestyle choices, including: ? Daily care of your teeth and gums. ? Regular physical activity. ? Eating a healthy diet. ? Avoiding tobacco and drug use. ? Limiting alcohol use. ? Practicing safe sex. ? Taking low-dose aspirin daily starting at age 58. ? Taking vitamin and mineral supplements as recommended by your health care provider. What happens during an annual well check? The services and screenings done by your health care provider during your annual well check will depend on your age, overall health, lifestyle risk factors, and family history of disease. Counseling Your health care provider may ask you questions about your:  Alcohol use.  Tobacco use.  Drug use.  Emotional well-being.  Home and relationship well-being.  Sexual activity.  Eating habits.  Work and work Statistician.  Method of birth control.  Menstrual cycle.  Pregnancy history.  Screening You may have the following tests or measurements:  Height, weight, and BMI.  Blood pressure.  Lipid and cholesterol levels. These may be checked every 5 years, or more frequently if you are over 81 years old.  Skin check.  Lung cancer screening. You may have this screening every year starting at age 78 if you have a 30-pack-year history of smoking and currently smoke or have quit within the past 15 years.  Fecal occult blood test (FOBT) of the stool. You may have this test every year starting at age 65.  Flexible sigmoidoscopy or colonoscopy. You may have a sigmoidoscopy every 5 years or a colonoscopy  every 10 years starting at age 30.  Hepatitis C blood test.  Hepatitis B blood test.  Sexually transmitted disease (STD) testing.  Diabetes screening. This is done by checking your blood sugar (glucose) after you have not eaten for a while (fasting). You may have this done every 1-3 years.  Mammogram. This may be done every 1-2 years. Talk to your health care provider about when you should start having regular mammograms. This may depend on whether you have a family history of breast cancer.  BRCA-related cancer screening. This may be done if you have a family history of breast, ovarian, tubal, or peritoneal cancers.  Pelvic exam and Pap test. This may be done every 3 years starting at age 80. Starting at age 36, this may be done every 5 years if you have a Pap test in combination with an HPV test.  Bone density scan. This is done to screen for osteoporosis. You may have this scan if you are at high risk for osteoporosis.  Discuss your test results, treatment options, and if necessary, the need for more tests with your health care provider. Vaccines Your health care provider may recommend certain vaccines, such as:  Influenza vaccine. This is recommended every year.  Tetanus, diphtheria, and acellular pertussis (Tdap, Td) vaccine. You may need a Td booster every 10 years.  Varicella vaccine. You may need this if you have not been vaccinated.  Zoster vaccine. You may need this after age 5.  Measles, mumps, and rubella (MMR) vaccine. You may need at least one dose of MMR if you were born in  1957 or later. You may also need a second dose.  Pneumococcal 13-valent conjugate (PCV13) vaccine. You may need this if you have certain conditions and were not previously vaccinated.  Pneumococcal polysaccharide (PPSV23) vaccine. You may need one or two doses if you smoke cigarettes or if you have certain conditions.  Meningococcal vaccine. You may need this if you have certain  conditions.  Hepatitis A vaccine. You may need this if you have certain conditions or if you travel or work in places where you may be exposed to hepatitis A.  Hepatitis B vaccine. You may need this if you have certain conditions or if you travel or work in places where you may be exposed to hepatitis B.  Haemophilus influenzae type b (Hib) vaccine. You may need this if you have certain conditions.  Talk to your health care provider about which screenings and vaccines you need and how often you need them. This information is not intended to replace advice given to you by your health care provider. Make sure you discuss any questions you have with your health care provider. Document Released: 06/28/2015 Document Revised: 02/19/2016 Document Reviewed: 04/02/2015 Elsevier Interactive Patient Education  2017 [employer].

## 2017-02-25 ENCOUNTER — Other Ambulatory Visit: Payer: Self-pay | Admitting: Osteopathic Medicine

## 2017-02-25 DIAGNOSIS — Z1231 Encounter for screening mammogram for malignant neoplasm of breast: Secondary | ICD-10-CM

## 2017-02-26 LAB — LIPID PANEL
CHOL/HDL RATIO: 2.5 (calc) (ref <5.0)
CHOLESTEROL: 186 mg/dL (ref <200)
HDL: 75 mg/dL (ref >50)
LDL CHOLESTEROL (CALC): 97 mg/dL
Non-HDL Cholesterol (Calc): 111 mg/dL (calc) (ref <130)
Triglycerides: 62 mg/dL (ref <150)

## 2017-02-26 LAB — CBC
HEMATOCRIT: 42.2 % (ref 35.0–45.0)
HEMOGLOBIN: 14.2 g/dL (ref 11.7–15.5)
MCH: 29.5 pg (ref 27.0–33.0)
MCHC: 33.6 g/dL (ref 32.0–36.0)
MCV: 87.7 fL (ref 80.0–100.0)
MPV: 11.2 fL (ref 7.5–12.5)
Platelets: 238 10*3/uL (ref 140–400)
RBC: 4.81 10*6/uL (ref 3.80–5.10)
RDW: 12.7 % (ref 11.0–15.0)
WBC: 3.1 10*3/uL — ABNORMAL LOW (ref 3.8–10.8)

## 2017-02-26 LAB — COMPLETE METABOLIC PANEL WITH GFR
AG Ratio: 1.8 (calc) (ref 1.0–2.5)
ALBUMIN MSPROF: 4.5 g/dL (ref 3.6–5.1)
ALT: 14 U/L (ref 6–29)
AST: 16 U/L (ref 10–35)
Alkaline phosphatase (APISO): 67 U/L (ref 33–130)
BILIRUBIN TOTAL: 0.6 mg/dL (ref 0.2–1.2)
BUN / CREAT RATIO: 18 (calc) (ref 6–22)
BUN: 20 mg/dL (ref 7–25)
CHLORIDE: 103 mmol/L (ref 98–110)
CO2: 27 mmol/L (ref 20–32)
CREATININE: 1.09 mg/dL — AB (ref 0.50–0.99)
Calcium: 9.3 mg/dL (ref 8.6–10.4)
GFR, EST AFRICAN AMERICAN: 63 mL/min/{1.73_m2} (ref >=60)
GFR, Est Non African American: 54 mL/min/{1.73_m2} — ABNORMAL LOW (ref >=60)
GLOBULIN: 2.5 g/dL (ref 1.9–3.7)
GLUCOSE: 98 mg/dL (ref 65–99)
Potassium: 4.6 mmol/L (ref 3.5–5.3)
SODIUM: 138 mmol/L (ref 135–146)
TOTAL PROTEIN: 7 g/dL (ref 6.1–8.1)

## 2017-02-26 LAB — VITAMIN D 25 HYDROXY (VIT D DEFICIENCY, FRACTURES): VIT D 25 HYDROXY: 43 ng/mL (ref 30–100)

## 2017-02-26 LAB — TSH: TSH: 1.31 mIU/L (ref 0.40–4.50)

## 2017-03-01 LAB — CYTOLOGY - PAP
Diagnosis: NEGATIVE
HPV: NOT DETECTED

## 2017-03-03 NOTE — Addendum Note (Signed)
Addended by: Deirdre Pippins on: 03/03/2017 05:59 PM   Modules accepted: Orders

## 2017-03-17 ENCOUNTER — Ambulatory Visit (INDEPENDENT_AMBULATORY_CARE_PROVIDER_SITE_OTHER): Payer: BC Managed Care – PPO

## 2017-03-17 DIAGNOSIS — Z1231 Encounter for screening mammogram for malignant neoplasm of breast: Secondary | ICD-10-CM | POA: Diagnosis not present

## 2017-03-24 LAB — BASIC METABOLIC PANEL WITH GFR
BUN: 20 mg/dL (ref 7–25)
CO2: 29 mmol/L (ref 20–32)
CREATININE: 0.92 mg/dL (ref 0.50–0.99)
Calcium: 9.5 mg/dL (ref 8.6–10.4)
Chloride: 104 mmol/L (ref 98–110)
GFR, EST NON AFRICAN AMERICAN: 66 mL/min/{1.73_m2} (ref >=60)
GFR, Est African American: 77 mL/min/{1.73_m2} (ref >=60)
GLUCOSE: 101 mg/dL — AB (ref 65–99)
Potassium: 4.3 mmol/L (ref 3.5–5.3)
Sodium: 140 mmol/L (ref 135–146)

## 2017-03-24 LAB — CBC WITH DIFFERENTIAL/PLATELET
BASOS ABS: 40 {cells}/uL (ref 0–200)
BASOS PCT: 1.2 %
EOS ABS: 69 {cells}/uL (ref 15–500)
Eosinophils Relative: 2.1 %
HEMATOCRIT: 42.3 % (ref 35.0–45.0)
HEMOGLOBIN: 14.4 g/dL (ref 11.7–15.5)
LYMPHS ABS: 1122 {cells}/uL (ref 850–3900)
MCH: 29.9 pg (ref 27.0–33.0)
MCHC: 34 g/dL (ref 32.0–36.0)
MCV: 87.9 fL (ref 80.0–100.0)
MONOS PCT: 11 %
MPV: 11.4 fL (ref 7.5–12.5)
NEUTROS ABS: 1706 {cells}/uL (ref 1500–7800)
Neutrophils Relative %: 51.7 %
Platelets: 239 10*3/uL (ref 140–400)
RBC: 4.81 10*6/uL (ref 3.80–5.10)
RDW: 12.6 % (ref 11.0–15.0)
Total Lymphocyte: 34 %
WBC mixed population: 363 cells/uL (ref 200–950)
WBC: 3.3 10*3/uL — ABNORMAL LOW (ref 3.8–10.8)

## 2017-05-25 ENCOUNTER — Other Ambulatory Visit: Payer: Self-pay

## 2017-05-25 DIAGNOSIS — D729 Disorder of white blood cells, unspecified: Secondary | ICD-10-CM

## 2017-05-25 LAB — CBC
HCT: 42.6 % (ref 35.0–45.0)
HEMOGLOBIN: 14.2 g/dL (ref 11.7–15.5)
MCH: 29.2 pg (ref 27.0–33.0)
MCHC: 33.3 g/dL (ref 32.0–36.0)
MCV: 87.7 fL (ref 80.0–100.0)
MPV: 11.2 fL (ref 7.5–12.5)
Platelets: 229 10*3/uL (ref 140–400)
RBC: 4.86 10*6/uL (ref 3.80–5.10)
RDW: 12.7 % (ref 11.0–15.0)
WBC: 3.7 10*3/uL — ABNORMAL LOW (ref 3.8–10.8)

## 2017-06-15 HISTORY — PX: COLONOSCOPY: SHX174

## 2017-08-02 ENCOUNTER — Other Ambulatory Visit: Payer: Self-pay

## 2017-08-02 MED ORDER — LEVOTHYROXINE SODIUM 75 MCG PO TABS: 75.0000 ug | ORAL_TABLET | Freq: Every day | ORAL | 0 refills | Status: DC

## 2017-08-10 ENCOUNTER — Ambulatory Visit: Payer: Self-pay | Admitting: Osteopathic Medicine

## 2017-11-03 ENCOUNTER — Ambulatory Visit (INDEPENDENT_AMBULATORY_CARE_PROVIDER_SITE_OTHER): Payer: BC Managed Care – PPO | Admitting: Osteopathic Medicine

## 2017-11-03 ENCOUNTER — Encounter: Payer: Self-pay | Admitting: Osteopathic Medicine

## 2017-11-03 VITALS — BP 108/56 | HR 63 | Temp 98.4°F | Wt 186.9 lb

## 2017-11-03 DIAGNOSIS — D729 Disorder of white blood cells, unspecified: Secondary | ICD-10-CM | POA: Diagnosis not present

## 2017-11-03 DIAGNOSIS — E039 Hypothyroidism, unspecified: Secondary | ICD-10-CM | POA: Diagnosis not present

## 2017-11-03 MED ORDER — LEVOTHYROXINE SODIUM 75 MCG PO TABS: 75.0000 ug | ORAL_TABLET | Freq: Every day | ORAL | 3 refills | Status: DC

## 2017-11-03 NOTE — Progress Notes (Signed)
HPI: Danielle Galvan is a 62 y.o. female who  has a past medical history of Hypothyroid (06/19/2014) and Shingles (11/2013).  she presents to [facility] today, 11/03/17,  for chief complaint of:  Thyroid check Hx abn WBC Labs  Due for labs, last TSH okay, she was told she needed to come in for checkup prior to refill.   Fatigue issues have also been concerning her lately.   Review preventive care, she is up to date, she'd rather not have to come back for annual in September since this is the beginning of the school year and a stressful time for her.     Past medical history, surgical history, and family history reviewed.  Current medication list and allergy/intolerance information reviewed.   (See remainder of HPI, ROS, Phys Exam below)    ASSESSMENT/PLAN:   Abnormal WBC count - Plan: CBC with Differential/Platelet  Hypothyroidism, unspecified type - Plan: TSH, Lipid panel, COMPLETE METABOLIC PANEL WITH GFR   Meds ordered this encounter  Medications  . levothyroxine (SYNTHROID, LEVOTHROID) 75 MCG tablet    Sig: Take 1 tablet (75 mcg total) by mouth daily before breakfast.    Dispense:  90 tablet    Refill:  3    Follow-up plan: Return for [employer] NEXT MAY 2020, see me sooner if needed .     ############################################ ############################################ ############################################ ############################################    Outpatient Encounter Medications as of 11/03/2017  Medication Sig  . levothyroxine (SYNTHROID, LEVOTHROID) 75 MCG tablet Take 1 tablet (75 mcg total) by mouth daily before breakfast.  . Clobetasol Prop Emollient Base (CLOBETASOL PROPIONATE E) 0.05 % emollient cream Apply to dermatitis twice a day as needed for up to two weeks. (Patient not taking: Reported on 11/03/2017)  . zoster vaccine live, PF, (ZOSTAVAX) 30865 UNT/0.65ML injection Inject 19,400 Units into  the skin once. (Patient not taking: Reported on 11/03/2017)   No facility-administered encounter medications on file as of 11/03/2017.    Allergies  Allergen Reactions  . Fluocinolone Rash  . Sulfa Antibiotics Hives and Rash  . Ciprofloxacin   . Ivy Leaf [Hedera Helix]   . Other Rash      Review of Systems:  Constitutional: No recent illness, +fatigue  HEENT: No  headache, no vision change  Cardiac: No  chest pain, No  pressure, No palpitations  Respiratory:  No  shortness of breath. No  Cough  Gastrointestinal: No  abdominal pain, no change on bowel habits  Musculoskeletal: No new myalgia/arthralgia  Skin: No  Rash  Neurologic: No  weakness, No  Dizziness  Psychiatric: No  concerns with depression, No  concerns with anxiety  Exam:  BP (!) 108/56 (BP Location: Left Arm, Patient Position: Sitting, Cuff Size: Normal)   Pulse 63   Temp 98.4 F (36.9 C) (Oral)   Wt 186 lb 14.4 oz (84.8 kg)   BMI 27.60 kg/m   Constitutional: VS see above. General Appearance: alert, well-developed, well-nourished, NAD  Eyes: Normal lids and conjunctive, non-icteric sclera  Ears, Nose, Mouth, Throat: MMM, Normal external inspection ears/nares/mouth/lips/gums.  Neck: No masses, trachea midline.   Respiratory: Normal respiratory effort. no wheeze, no rhonchi, no rales  Cardiovascular: S1/S2 normal, no murmur, no rub/gallop auscultated. RRR.   Musculoskeletal: Gait normal. Symmetric and independent movement of all extremities  Neurological: Normal balance/coordination. No tremor.  Skin: warm, dry, intact.   Psychiatric: Normal judgment/insight. Normal mood and affect. Oriented x3.   Visit summary with medication list and pertinent instructions  was printed for patient to review, advised to alert [place] if any changes needed. All questions at time of visit were answered - patient instructed to contact office with any additional concerns. ER/RTC precautions were reviewed with the patient  and understanding verbalized.   Follow-up plan: Return for [facility] NEXT MAY 2020, see me sooner if needed .  Note: Total time spent 15 minutes, greater than 50% of the visit was spent face-to-face counseling and coordinating care for the following: The primary encounter diagnosis was Abnormal WBC count. A diagnosis of Hypothyroidism, unspecified type was also pertinent to this visit.Marland Kitchen  Please note: voice recognition software was used to produce this document, and typos may escape review. Please contact Dr. Lyn Hollingshead for any needed clarifications.

## 2017-11-04 ENCOUNTER — Encounter: Payer: Self-pay | Admitting: Osteopathic Medicine

## 2017-11-25 LAB — LIPID PANEL
Cholesterol: 189 mg/dL (ref <200)
HDL: 69 mg/dL (ref >50)
LDL Cholesterol (Calc): 102 mg/dL (calc) — ABNORMAL HIGH
NON-HDL CHOLESTEROL (CALC): 120 mg/dL (ref <130)
TRIGLYCERIDES: 87 mg/dL (ref <150)
Total CHOL/HDL Ratio: 2.7 (calc) (ref <5.0)

## 2017-11-25 LAB — CBC WITH DIFFERENTIAL/PLATELET
BASOS PCT: 0.9 %
Basophils Absolute: 30 cells/uL (ref 0–200)
Eosinophils Absolute: 40 cells/uL (ref 15–500)
Eosinophils Relative: 1.2 %
HCT: 43.5 % (ref 35.0–45.0)
Hemoglobin: 14.7 g/dL (ref 11.7–15.5)
Lymphs Abs: 921 cells/uL (ref 850–3900)
MCH: 29.3 pg (ref 27.0–33.0)
MCHC: 33.8 g/dL (ref 32.0–36.0)
MCV: 86.7 fL (ref 80.0–100.0)
MONOS PCT: 10.9 %
MPV: 11.1 fL (ref 7.5–12.5)
NEUTROS PCT: 59.1 %
Neutro Abs: 1950 cells/uL (ref 1500–7800)
PLATELETS: 239 10*3/uL (ref 140–400)
RBC: 5.02 10*6/uL (ref 3.80–5.10)
RDW: 12.8 % (ref 11.0–15.0)
TOTAL LYMPHOCYTE: 27.9 %
WBC mixed population: 360 cells/uL (ref 200–950)
WBC: 3.3 10*3/uL — AB (ref 3.8–10.8)

## 2017-11-25 LAB — TSH: TSH: 1.88 m[IU]/L (ref 0.40–4.50)

## 2017-11-25 LAB — COMPLETE METABOLIC PANEL WITH GFR
AG Ratio: 1.6 (calc) (ref 1.0–2.5)
ALT: 23 U/L (ref 6–29)
AST: 21 U/L (ref 10–35)
Albumin: 4.4 g/dL (ref 3.6–5.1)
Alkaline phosphatase (APISO): 75 U/L (ref 33–130)
BILIRUBIN TOTAL: 0.4 mg/dL (ref 0.2–1.2)
BUN / CREAT RATIO: 20 (calc) (ref 6–22)
BUN: 21 mg/dL (ref 7–25)
CHLORIDE: 101 mmol/L (ref 98–110)
CO2: 30 mmol/L (ref 20–32)
Calcium: 9.5 mg/dL (ref 8.6–10.4)
Creat: 1.06 mg/dL — ABNORMAL HIGH (ref 0.50–0.99)
GFR, EST AFRICAN AMERICAN: 64 mL/min/{1.73_m2} (ref >=60)
GFR, EST NON AFRICAN AMERICAN: 55 mL/min/{1.73_m2} — AB (ref >=60)
GLUCOSE: 95 mg/dL (ref 65–99)
Globulin: 2.8 g/dL (calc) (ref 1.9–3.7)
Potassium: 4.3 mmol/L (ref 3.5–5.3)
Sodium: 137 mmol/L (ref 135–146)
TOTAL PROTEIN: 7.2 g/dL (ref 6.1–8.1)

## 2018-01-27 LAB — HM COLONOSCOPY

## 2018-08-15 ENCOUNTER — Other Ambulatory Visit: Payer: Self-pay | Admitting: Osteopathic Medicine

## 2018-08-15 DIAGNOSIS — Z1231 Encounter for screening mammogram for malignant neoplasm of breast: Secondary | ICD-10-CM

## 2018-08-17 ENCOUNTER — Ambulatory Visit (INDEPENDENT_AMBULATORY_CARE_PROVIDER_SITE_OTHER): Payer: BC Managed Care – PPO

## 2018-08-17 DIAGNOSIS — Z1231 Encounter for screening mammogram for malignant neoplasm of breast: Secondary | ICD-10-CM | POA: Diagnosis not present

## 2018-09-13 DIAGNOSIS — M8580 Other specified disorders of bone density and structure, unspecified site: Secondary | ICD-10-CM | POA: Insufficient documentation

## 2018-10-06 ENCOUNTER — Other Ambulatory Visit: Payer: Self-pay | Admitting: Osteopathic Medicine

## 2018-10-06 ENCOUNTER — Telehealth: Payer: Self-pay | Admitting: Osteopathic Medicine

## 2018-10-06 DIAGNOSIS — Z0000 Encounter for general adult medical examination without abnormal findings: Secondary | ICD-10-CM

## 2018-10-06 DIAGNOSIS — E039 Hypothyroidism, unspecified: Secondary | ICD-10-CM

## 2018-10-06 NOTE — Telephone Encounter (Signed)
Left a detailed vm msg for pt regarding med refill sent to pharmacy. Call back info provided.

## 2018-10-06 NOTE — Telephone Encounter (Signed)
Forwarding to provider for review. Last thyroid check was 11/25/2017. Pls advise, thanks.

## 2018-10-06 NOTE — Telephone Encounter (Signed)
Patient would like lab orders placed now for physical on 11/16/2018. States that she would like to come in sooner to get the labs done before, rather than during the school year.

## 2018-10-06 NOTE — Telephone Encounter (Signed)
PT requested a refill on thyroid medications. She has an appointment set for physical on 11/16/18.  Please advise.

## 2018-10-06 NOTE — Telephone Encounter (Signed)
Labs pended for PCP review.  

## 2018-10-07 NOTE — Telephone Encounter (Signed)
Reviewed and signed, thanks so much!

## 2018-10-07 NOTE — Telephone Encounter (Signed)
Left VM with status update.  

## 2018-10-12 LAB — COMPLETE METABOLIC PANEL WITH GFR
AG Ratio: 1.4 (calc) (ref 1.0–2.5)
ALT: 13 U/L (ref 6–29)
AST: 16 U/L (ref 10–35)
Albumin: 4.2 g/dL (ref 3.6–5.1)
Alkaline phosphatase (APISO): 69 U/L (ref 37–153)
BUN/Creatinine Ratio: 18 (calc) (ref 6–22)
BUN: 18 mg/dL (ref 7–25)
CO2: 27 mmol/L (ref 20–32)
Calcium: 9.7 mg/dL (ref 8.6–10.4)
Chloride: 103 mmol/L (ref 98–110)
Creat: 1 mg/dL — ABNORMAL HIGH (ref 0.50–0.99)
GFR, Est African American: 68 mL/min/{1.73_m2} (ref >=60)
GFR, Est Non African American: 59 mL/min/{1.73_m2} — ABNORMAL LOW (ref >=60)
Globulin: 2.9 g/dL (calc) (ref 1.9–3.7)
Glucose, Bld: 89 mg/dL (ref 65–99)
Potassium: 4.5 mmol/L (ref 3.5–5.3)
Sodium: 137 mmol/L (ref 135–146)
Total Bilirubin: 0.5 mg/dL (ref 0.2–1.2)
Total Protein: 7.1 g/dL (ref 6.1–8.1)

## 2018-10-12 LAB — LIPID PANEL
Cholesterol: 197 mg/dL (ref <200)
HDL: 69 mg/dL (ref >=50)
LDL Cholesterol (Calc): 109 mg/dL (calc) — ABNORMAL HIGH
Non-HDL Cholesterol (Calc): 128 mg/dL (calc) (ref <130)
Total CHOL/HDL Ratio: 2.9 (calc) (ref <5.0)
Triglycerides: 98 mg/dL (ref <150)

## 2018-10-12 LAB — CBC
HCT: 42.8 % (ref 35.0–45.0)
Hemoglobin: 14.4 g/dL (ref 11.7–15.5)
MCH: 29.6 pg (ref 27.0–33.0)
MCHC: 33.6 g/dL (ref 32.0–36.0)
MCV: 88.1 fL (ref 80.0–100.0)
MPV: 11.1 fL (ref 7.5–12.5)
Platelets: 252 10*3/uL (ref 140–400)
RBC: 4.86 10*6/uL (ref 3.80–5.10)
RDW: 12.9 % (ref 11.0–15.0)
WBC: 4 10*3/uL (ref 3.8–10.8)

## 2018-10-12 LAB — TSH: TSH: 4.7 mIU/L — ABNORMAL HIGH (ref 0.40–4.50)

## 2018-10-12 MED ORDER — LEVOTHYROXINE SODIUM 88 MCG PO TABS: 88.0000 ug | ORAL_TABLET | Freq: Every day | ORAL | 0 refills | Status: DC

## 2018-10-12 NOTE — Addendum Note (Signed)
Addended by: Deirdre Pippins on: 10/12/2018 03:49 PM   Modules accepted: Orders

## 2018-11-16 ENCOUNTER — Ambulatory Visit (INDEPENDENT_AMBULATORY_CARE_PROVIDER_SITE_OTHER): Payer: BC Managed Care – PPO | Admitting: Osteopathic Medicine

## 2018-11-16 ENCOUNTER — Ambulatory Visit: Payer: BC Managed Care – PPO

## 2018-11-16 ENCOUNTER — Encounter: Payer: Self-pay | Admitting: Osteopathic Medicine

## 2018-11-16 ENCOUNTER — Other Ambulatory Visit: Payer: Self-pay

## 2018-11-16 VITALS — BP 113/51 | HR 63 | Wt 184.0 lb

## 2018-11-16 DIAGNOSIS — E039 Hypothyroidism, unspecified: Secondary | ICD-10-CM | POA: Diagnosis not present

## 2018-11-16 DIAGNOSIS — Z23 Encounter for immunization: Secondary | ICD-10-CM

## 2018-11-16 DIAGNOSIS — Z0000 Encounter for general adult medical examination without abnormal findings: Secondary | ICD-10-CM

## 2018-11-16 DIAGNOSIS — Z13820 Encounter for screening for osteoporosis: Secondary | ICD-10-CM

## 2018-11-16 MED ORDER — ZOSTER VAC RECOMB ADJUVANTED 50 MCG/0.5ML IM SUSR: 0.5000 mL | Freq: Once | INTRAMUSCULAR | 1 refills | Status: AC

## 2018-11-16 NOTE — Progress Notes (Signed)
HPI: Danielle SchwartzKathryn Galvan is a 63 y.o. female who  has a past medical history of Hypothyroid (06/19/2014) and Shingles (11/2013).  [facility], 11/16/18,  for chief complaint of: Annual Physical     Patient here for annual physical / wellness exam.  See preventive care reviewed as below.  Recent lab results reviewed in detail with the patient   Additional concerns today include: None  She is feeling much better since increasing dose of levothyroxine.     Past medical, surgical, social and family history reviewed:  Patient Active Problem List   Diagnosis Date Noted  . Left knee pain 04/30/2016  . Benign skin lesion of nose 04/30/2016  . Preventive measure 06/28/2014  . Hypothyroid 06/19/2014  . Vaginal dryness 06/19/2014    No past surgical history on file.  Social History   Tobacco Use  . Smoking status: Never Smoker  . Smokeless tobacco: Never Used  Substance Use Topics  . Alcohol use: No    Alcohol/week: 0.0 standard drinks    Family History  Problem Relation Age of Onset  . Prostate cancer Father   . Dementia Father      Current medication list and allergy/intolerance information reviewed:    Current Outpatient Medications  Medication Sig Dispense Refill  . levothyroxine (SYNTHROID) 88 MCG tablet Take 1 tablet (88 mcg total) by mouth daily. 90 tablet 0   No current facility-administered medications for this visit.     Allergies  Allergen Reactions  . Fluocinolone Rash  . Sulfa Antibiotics Hives and Rash  . Ciprofloxacin   . Ivy Leaf [Hedera Helix]   . Other Rash      Review of Systems:  Constitutional:  No  fever, no chills, No recent illness, No unintentional weight changes. No significant fatigue.   HEENT: No  headache, no vision change, no hearing change, No sore throat, No  sinus pressure  Cardiac: No  chest pain, No  pressure, No palpitations, No  Orthopnea  Respiratory:  No  shortness  of breath. No  Cough  Gastrointestinal: No  abdominal pain, No  nausea, No  vomiting,  No  blood in stool, No  diarrhea, No  constipation   Musculoskeletal: No new myalgia/arthralgia  Skin: No  Rash, No other wounds/concerning lesions  Genitourinary: No  incontinence, No  abnormal genital bleeding, No abnormal genital discharge  Hem/Onc: No  easy bruising/bleeding, No  abnormal lymph node  Endocrine: No cold intolerance,  No heat intolerance. No polyuria/polydipsia/polyphagia   Neurologic: No  weakness, No  dizziness, No  slurred speech/focal weakness/facial droop  Psychiatric: No  concerns with depression, No  concerns with anxiety, No sleep problems, No mood problems  Exam:  BP (!) 113/51   Pulse 63   Wt 184 lb (83.5 kg)   SpO2 100%   BMI 27.17 kg/m   Constitutional: VS see above. General Appearance: alert, well-developed, well-nourished, NAD  Eyes: Normal lids and conjunctive, non-icteric sclera  Ears, Nose, Mouth, Throat: MMM, Normal external inspection ears/nares/mouth/lips/gums. TM normal bilaterally. Pharynx/tonsils no erythema, no exudate. Nasal mucosa normal.   Neck: No masses, trachea midline. No thyroid enlargement. No tenderness/mass appreciated. No lymphadenopathy  Respiratory: Normal respiratory effort. no wheeze, no rhonchi, no rales  Cardiovascular: S1/S2 normal, no murmur, no rub/gallop auscultated. RRR. No lower extremity edema.  Gastrointestinal: Nontender, no masses. No hepatomegaly, no splenomegaly. No hernia appreciated. Bowel sounds normal. Rectal exam deferred.   Musculoskeletal: Gait normal. No clubbing/cyanosis of digits.  Neurological: Normal balance/coordination. No tremor. No cranial nerve deficit on limited exam. Motor and sensation intact and symmetric. Cerebellar reflexes intact.   Skin: warm, dry, intact. No rash/ulcer. No concerning nevi or subq nodules on limited exam.    Psychiatric: Normal judgment/insight. Normal mood and affect.  Oriented x3.    Results for orders placed or performed in visit on 11/16/18 (from the past 72 hour(s))  TSH     Status: Abnormal   Collection Time: 11/16/18  4:31 PM  Result Value Ref Range   TSH 0.39 (L) 0.40 - 4.50 mIU/L      ASSESSMENT/PLAN: The primary encounter diagnosis was Annual physical exam. Diagnoses of Hypothyroidism, unspecified type, Screening for osteoporosis, and Need for prophylactic vaccination against Streptococcus pneumoniae (pneumococcus) were also pertinent to this visit.    Orders Placed This Encounter  Procedures  . DG Bone Density  . Pneumococcal polysaccharide vaccine 23-valent greater than or equal to 2yo subcutaneous/IM  . TSH  . TSH   See result note, TSH borderline low, I think we would be okay to skip a dose of her current pill once per week and recheck in another 6 to 8 weeks.   Meds ordered this encounter  Medications  . Zoster Vaccine Adjuvanted [facility]) injection    Sig: Inject 0.5 mLs into the muscle once for 1 dose. Repeat in 2-6 months.    Dispense:  0.5 mL    Refill:  1    Patient Instructions  General Preventive Care  Most recent routine screening lipids/other labs: already done!  Blood pressure: 130/80 or less   Tobacco: don't!   Alcohol: responsible moderation is ok for most adults - if you have concerns about your alcohol intake, please talk to me!   Exercise: as tolerated to reduce risk of cardiovascular disease and diabetes. Strength training will also prevent osteoporosis.   Mental health: if need for mental health care (medicines, counseling, other), or concerns about moods, please let me know!   Sexual health: if need for STD testing, or if concerns with libido/pain problems, please let me know!  Advanced Directive: Living Will and/or Healthcare Power of Attorney recommended for all adults, regardless of age or health.  Vaccines  Flu vaccine: recommended for almost everyone, every fall.   Shingles vaccine:  Shingrix recommended after age 42.   Pneumonia vaccines: Pneumovax recommended after age 100, or sooner if certain medical conditions.   Tetanus booster: Tdap recommended every 10 years. Due 2026.  Cancer screenings   Colon cancer screening: colonoscopy is due!   Breast cancer screening: mammogram annually after age 75.   Cervical cancer screening: Can usually stop Pap at age 16 or w/ hysterectomy.   Lung cancer screening: not needed for non-smokers  Infection screenings . HIV: recommended screening at least once age 52-65, more often as needed. . Gonorrhea/Chlamydia: screening as needed. . Hepatitis C: recommended for anyone born 16-1967 . TB: certain at-risk populations, or depending on work requirements and/or travel history Other . Bone Density Test: recommended for women at age 45        Visit summary with medication list and pertinent instructions was printed for patient to review. All questions at time of visit were answered - patient instructed to contact office with any additional concerns or updates. ER/RTC precautions were reviewed with the patient.     Please note: voice recognition software was used to produce this document, and typos may escape review. Please contact Dr. Lyn Hollingshead for any needed clarifications.  Follow-up plan: Return in about 1 year (around 11/16/2019) for annual physical, sooner if needed. Marland Kitchen

## 2018-11-16 NOTE — Patient Instructions (Addendum)
General Preventive Care  Most recent routine screening lipids/other labs: already done!  Blood pressure: 130/80 or less   Tobacco: don't!   Alcohol: responsible moderation is ok for most adults - if you have concerns about your alcohol intake, please talk to me!   Exercise: as tolerated to reduce risk of cardiovascular disease and diabetes. Strength training will also prevent osteoporosis.   Mental health: if need for mental health care (medicines, counseling, other), or concerns about moods, please let me know!   Sexual health: if need for STD testing, or if concerns with libido/pain problems, please let me know!  Advanced Directive: Living Will and/or Healthcare Power of Attorney recommended for all adults, regardless of age or health.  Vaccines  Flu vaccine: recommended for almost everyone, every fall.   Shingles vaccine: Shingrix recommended after age 15.   Pneumonia vaccines: Pneumovax recommended after age 79, or sooner if certain medical conditions.   Tetanus booster: Tdap recommended every 10 years. Due 2026.  Cancer screenings   Colon cancer screening: colonoscopy is due!   Breast cancer screening: mammogram annually after age 5.   Cervical cancer screening: Can usually stop Pap at age 70 or w/ hysterectomy.   Lung cancer screening: not needed for non-smokers  Infection screenings . HIV: recommended screening at least once age 51-65, more often as needed. . Gonorrhea/Chlamydia: screening as needed. . Hepatitis C: recommended for anyone born 44-1967 . TB: certain at-risk populations, or depending on work requirements and/or travel history Other . Bone Density Test: recommended for women at age 34

## 2018-11-17 LAB — TSH: TSH: 0.39 mIU/L — ABNORMAL LOW (ref 0.40–4.50)

## 2018-11-18 ENCOUNTER — Encounter: Payer: Self-pay | Admitting: Osteopathic Medicine

## 2018-11-30 ENCOUNTER — Other Ambulatory Visit: Payer: BC Managed Care – PPO

## 2018-12-07 ENCOUNTER — Other Ambulatory Visit: Payer: Self-pay | Admitting: Osteopathic Medicine

## 2018-12-07 DIAGNOSIS — Z780 Asymptomatic menopausal state: Secondary | ICD-10-CM

## 2018-12-14 ENCOUNTER — Ambulatory Visit (INDEPENDENT_AMBULATORY_CARE_PROVIDER_SITE_OTHER): Payer: BC Managed Care – PPO

## 2018-12-14 ENCOUNTER — Other Ambulatory Visit: Payer: Self-pay

## 2018-12-14 DIAGNOSIS — Z780 Asymptomatic menopausal state: Secondary | ICD-10-CM

## 2019-01-05 ENCOUNTER — Other Ambulatory Visit: Payer: Self-pay | Admitting: Osteopathic Medicine

## 2019-01-06 ENCOUNTER — Other Ambulatory Visit: Payer: Self-pay | Admitting: Osteopathic Medicine

## 2019-01-06 NOTE — Telephone Encounter (Signed)
Fill 30-day supply of thyroid medication, patient needs blood work before additional refills will be authorized.  Orders are already in, can come to the lab at her convenience.

## 2019-01-06 NOTE — Telephone Encounter (Signed)
Forwarding medication refill request to PCP for review. 

## 2019-01-17 NOTE — Telephone Encounter (Signed)
NEEDS LABS DONE Orders are in  Please call patient and let her know that she needs to come to the lab for blood work to check thyroid levels

## 2019-01-17 NOTE — Telephone Encounter (Signed)
Called patient and notified needs labs before refill will be given. KG LPN

## 2019-01-19 ENCOUNTER — Other Ambulatory Visit: Payer: Self-pay | Admitting: Osteopathic Medicine

## 2019-01-19 LAB — TSH: TSH: 2.94 mIU/L (ref 0.40–4.50)

## 2019-01-19 MED ORDER — LEVOTHYROXINE SODIUM 88 MCG PO TABS: 88.0000 ug | ORAL_TABLET | Freq: Every day | ORAL | 3 refills | Status: DC

## 2019-02-24 ENCOUNTER — Other Ambulatory Visit: Payer: Self-pay

## 2019-02-24 ENCOUNTER — Ambulatory Visit (INDEPENDENT_AMBULATORY_CARE_PROVIDER_SITE_OTHER): Payer: BC Managed Care – PPO | Admitting: Osteopathic Medicine

## 2019-02-24 DIAGNOSIS — Z23 Encounter for immunization: Secondary | ICD-10-CM

## 2019-02-24 MED ORDER — LEVOTHYROXINE SODIUM 88 MCG PO TABS: 88.0000 ug | ORAL_TABLET | Freq: Every day | ORAL | 3 refills | Status: DC

## 2019-02-24 NOTE — Progress Notes (Signed)
Pt here for flu shot. Afebrile,no recent illness. Vaccination given, pt tolerated well.   While reviewing pt's med list she stated that she takes the Levothyroxine 88 mcg 6 days a week. However, on the prescription the sig states that she is to take this daily. She said that after she had her labs done to check her TSH this was normal and she has been taking the medication as she was told which is 6 days not daily. I did look back at her previous OV and labs and advised that she continue to take like she's been doing but advised her that I would fwd this to Dr. Sheppard Coil for clarification.      Maryruth Eve, Lahoma Crocker, CMA

## 2019-02-24 NOTE — Progress Notes (Signed)
Updated med list, my error! See lab note from TSH check

## 2019-08-24 ENCOUNTER — Other Ambulatory Visit: Payer: Self-pay | Admitting: Osteopathic Medicine

## 2019-08-24 DIAGNOSIS — Z1231 Encounter for screening mammogram for malignant neoplasm of breast: Secondary | ICD-10-CM

## 2019-09-20 ENCOUNTER — Ambulatory Visit (INDEPENDENT_AMBULATORY_CARE_PROVIDER_SITE_OTHER): Payer: BC Managed Care – PPO

## 2019-09-20 ENCOUNTER — Other Ambulatory Visit: Payer: Self-pay

## 2019-09-20 DIAGNOSIS — Z1231 Encounter for screening mammogram for malignant neoplasm of breast: Secondary | ICD-10-CM

## 2019-09-20 IMAGING — MG DIGITAL SCREENING BILAT W/ TOMO W/ CAD
8 series · 8 of 24 positions shown · non-contrast
Comparison: Previous exam(s).

CLINICAL DATA: Screening.

EXAM:
DIGITAL SCREENING BILATERAL MAMMOGRAM WITH TOMO AND CAD

[R MLO synth-2D]
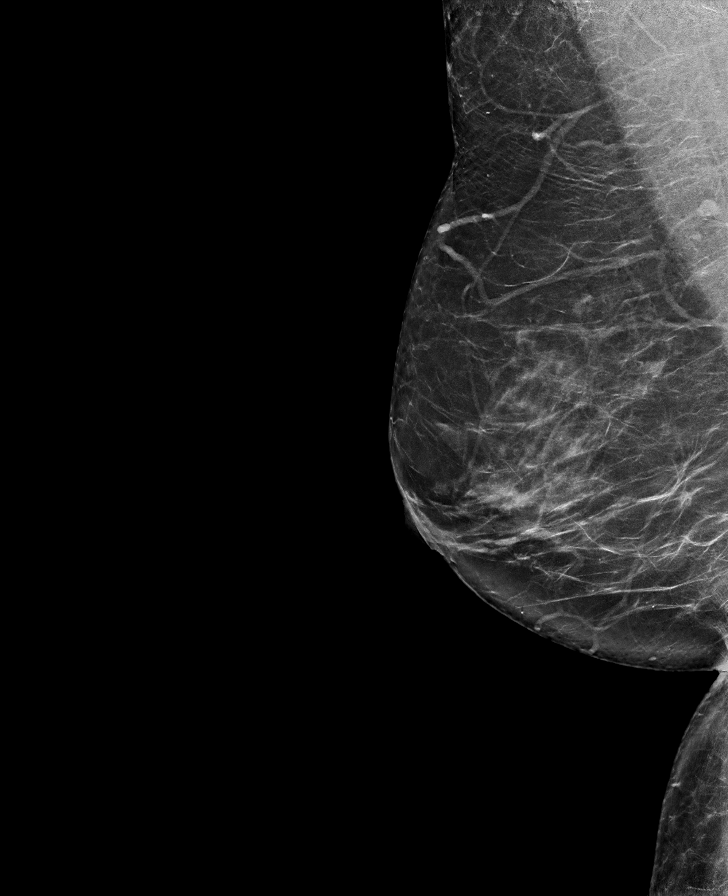

[L CC synth-2D]
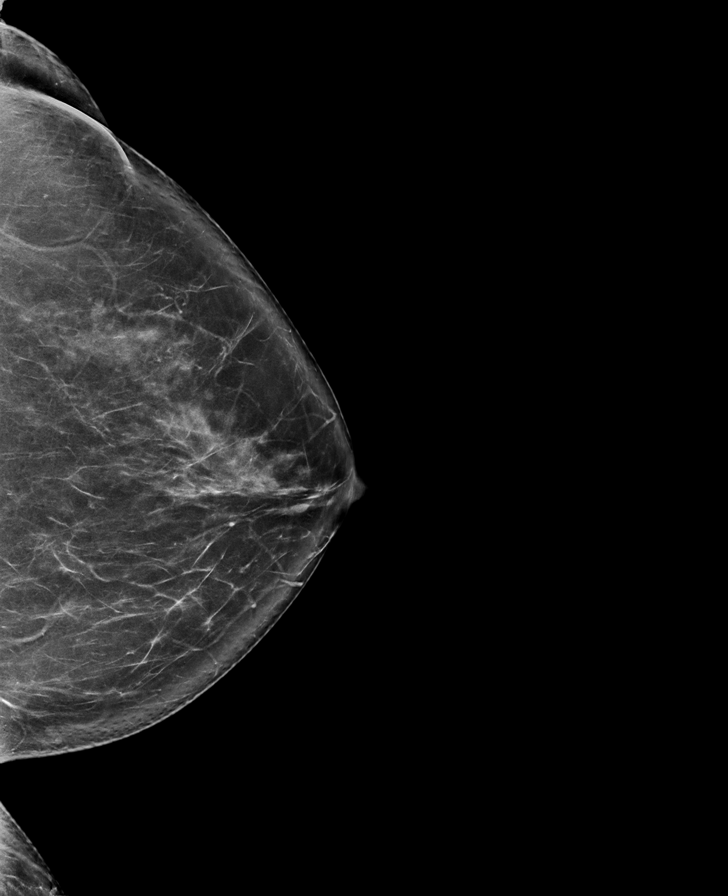

[L MLO synth-2D]
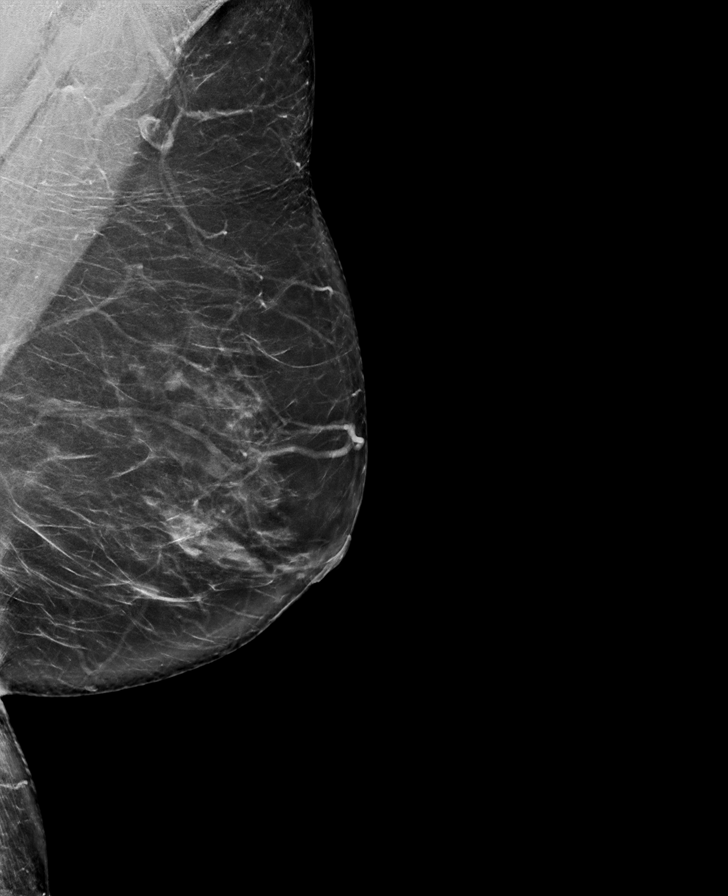

[R CC synth-2D]
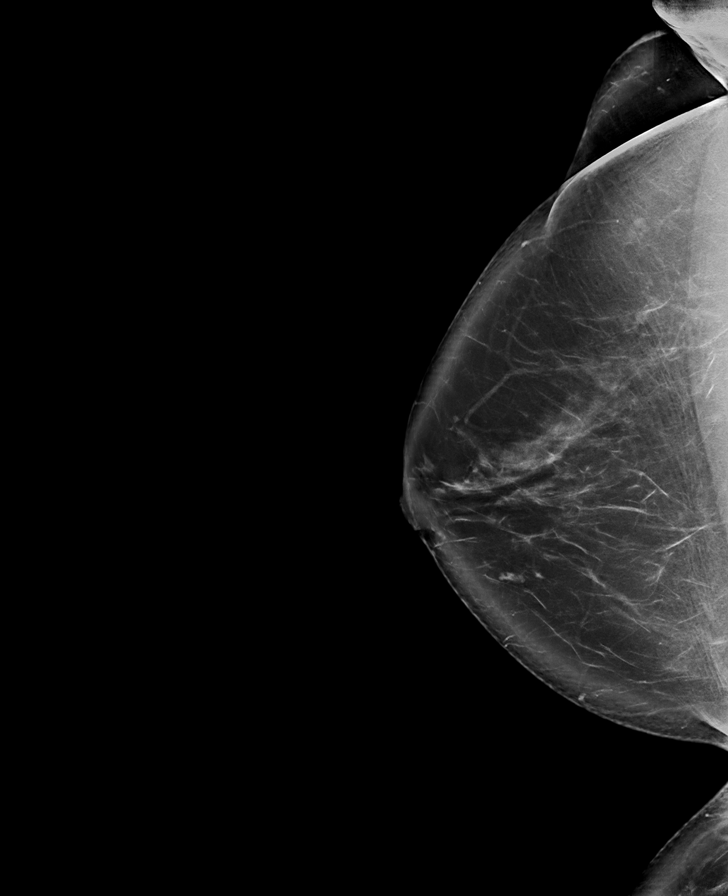

[L MLO tomo · tomo slice 44/87.0]
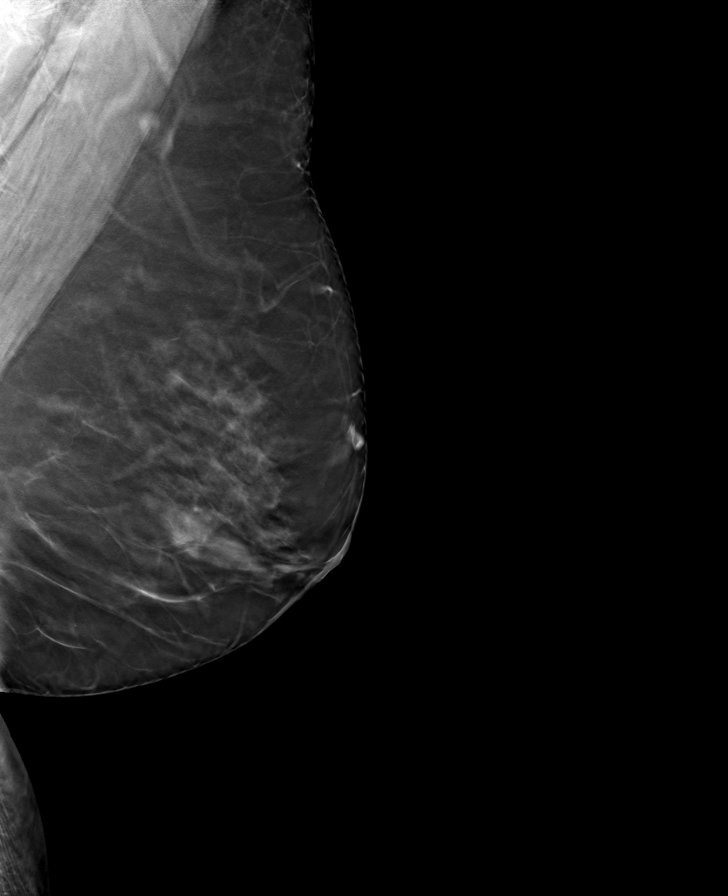

[L CC tomo · tomo slice 41/81.0]
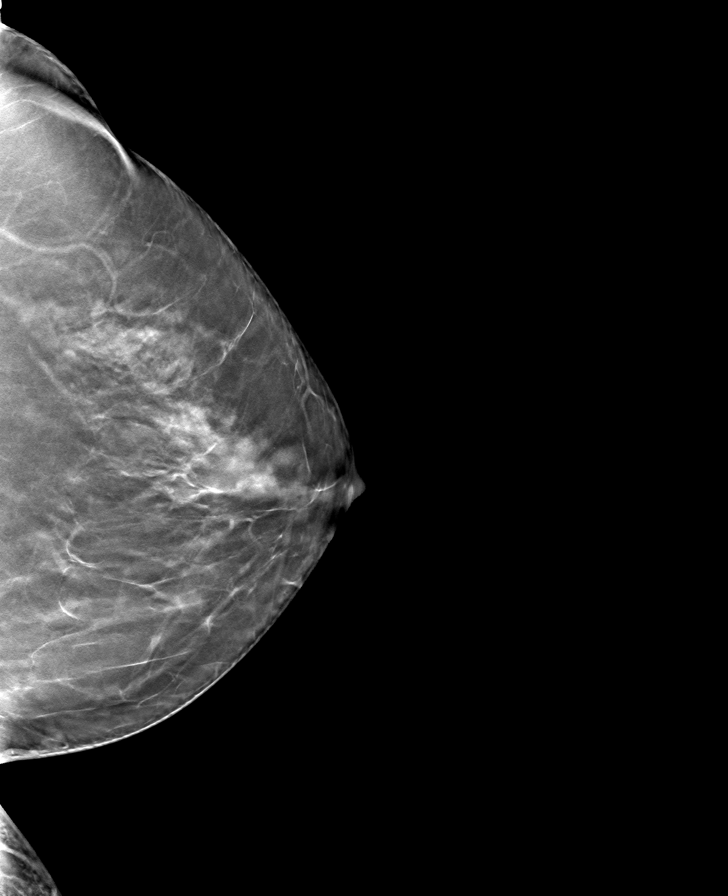

[R CC tomo · tomo slice 50/99.0]
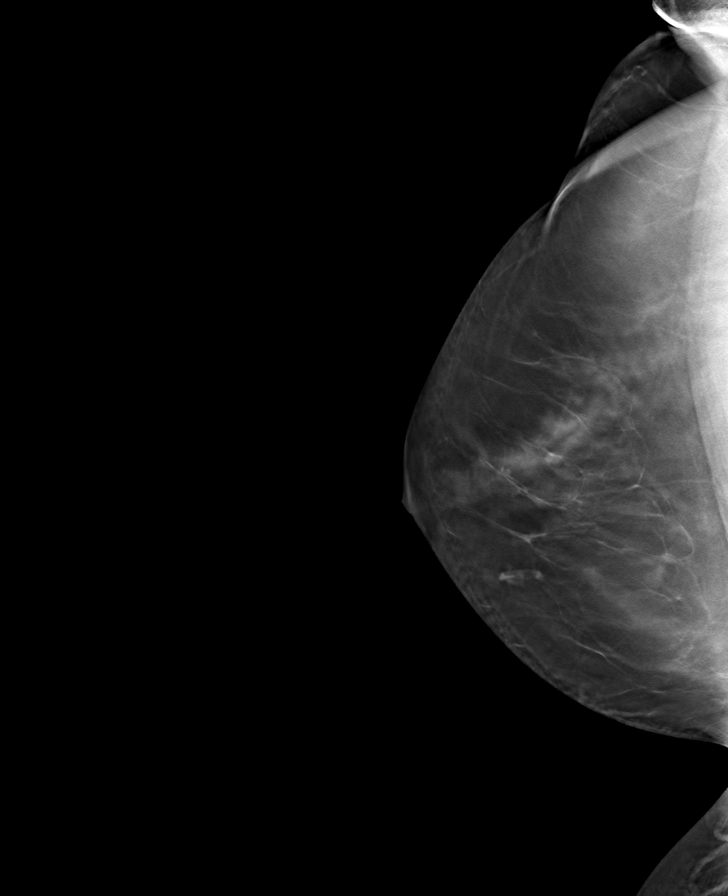

[R MLO tomo · tomo slice 41/80.0]
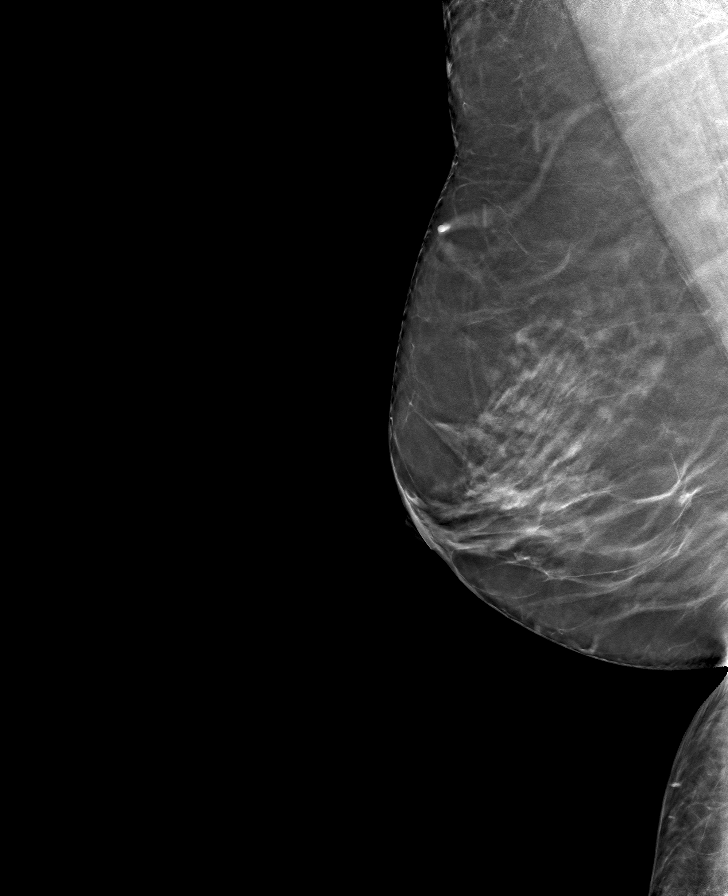

[8 of 24 positions shown; findings below may reference images not displayed]

ACR Breast Density Category b: There are scattered areas of
fibroglandular density.
FINDINGS: There are no findings suspicious for malignancy. Images were
processed with CAD.
IMPRESSION: No mammographic evidence of malignancy. A result letter of this
screening mammogram will be mailed directly to the patient.

RECOMMENDATION:
Screening mammogram in one year. (Code:[TQ])

BI-RADS CATEGORY  1: Negative.

## 2019-11-16 ENCOUNTER — Encounter: Payer: BC Managed Care – PPO | Admitting: Osteopathic Medicine

## 2019-12-19 ENCOUNTER — Encounter: Payer: Self-pay | Admitting: Osteopathic Medicine

## 2019-12-19 ENCOUNTER — Ambulatory Visit (INDEPENDENT_AMBULATORY_CARE_PROVIDER_SITE_OTHER): Payer: BC Managed Care – PPO | Admitting: Osteopathic Medicine

## 2019-12-19 VITALS — BP 118/76 | HR 69 | Temp 98.0°F | Ht 68.0 in | Wt 188.0 lb

## 2019-12-19 DIAGNOSIS — Z0000 Encounter for general adult medical examination without abnormal findings: Secondary | ICD-10-CM

## 2019-12-19 DIAGNOSIS — E039 Hypothyroidism, unspecified: Secondary | ICD-10-CM

## 2019-12-19 NOTE — Patient Instructions (Addendum)
General Preventive Care  Most recent routine screening labs: ordered.   Blood pressure goal 130/80 or less.   Tobacco: don't!   Alcohol: responsible moderation is ok for most adults - if you have concerns about your alcohol intake, please talk to me!   Exercise: as tolerated to reduce risk of cardiovascular disease and diabetes. Strength training will also prevent osteoporosis.   Mental health: if need for mental health care (medicines, counseling, other), or concerns about moods, please let me know!   Sexual / Reproductive health: if need for STD testing, or if concerns with libido/pain problems, please let me know! I  Advanced Directive: Living Will and/or Healthcare Power of Attorney recommended for all adults, regardless of age or health.  Vaccines  Flu vaccine: for almost everyone, every fall.   Shingles vaccine: after age 14.   Pneumonia vaccine: done.  Tetanus booster: every 10 years , due 2026  COVID vaccine: THANKS for getting your vaccine! :) Cancer screenings   Colon cancer screening: for everyone age 19-75. Colonoscopy done 2017 and due again in 2022   Breast cancer screening: mammogram annually after age 75.   Cervical cancer screening: Can stop at age 31 if previous results normal  Lung cancer screening: not needed for non-smokers  Infection screenings  . HIV: recommended screening at least once age 32-65 . Gonorrhea/Chlamydia: screening as needed . Hepatitis C: recommended once for everyone age 74-75 . TB: certain at-risk populations, or depending on work requirements and/or travel history Other . Bone Density Test: recheck die 2022. Calcium 1300-1500 mg daily, Vitamin D 2000 units daily

## 2019-12-19 NOTE — Progress Notes (Signed)
Danielle Galvan is a 64 y.o. female who presents to  [facility] at [facility]  today, 12/19/19, seeking care for the following:  . Annual Physical  . Due for labs  . Occasional finger stiffness but not too bothersome     ASSESSMENT & PLAN with other pertinent findings:  The primary encounter diagnosis was Annual physical exam. A diagnosis of Hypothyroidism, unspecified type was also pertinent to this visit.     Patient Instructions  General Preventive Care  Most recent routine screening labs: ordered.   Blood pressure goal 130/80 or less.   Tobacco: don't!   Alcohol: responsible moderation is ok for most adults - if you have concerns about your alcohol intake, please talk to me!   Exercise: as tolerated to reduce risk of cardiovascular disease and diabetes. Strength training will also prevent osteoporosis.   Mental health: if need for mental health care (medicines, counseling, other), or concerns about moods, please let me know!   Sexual / Reproductive health: if need for STD testing, or if concerns with libido/pain problems, please let me know! I  Advanced Directive: Living Will and/or Healthcare Power of Attorney recommended for all adults, regardless of age or health.  Vaccines  Flu vaccine: for almost everyone, every fall.   Shingles vaccine: after age 56.   Pneumonia vaccine: done.  Tetanus booster: every 10 years , due 2026  COVID vaccine: THANKS for getting your vaccine! :) Cancer screenings   Colon cancer screening: for everyone age 67-75. Colonoscopy done 2017 and due again in 2022   Breast cancer screening: mammogram annually after age 67.   Cervical cancer screening: Can stop at age 75 if previous results normal  Lung cancer screening: not needed for non-smokers  Infection screenings  . HIV: recommended screening at least once age 41-65 . Gonorrhea/Chlamydia: screening as needed . Hepatitis C: recommended  once for everyone age 70-75 . TB: certain at-risk populations, or depending on work requirements and/or travel history Other . Bone Density Test: recheck die 2022. Calcium 1300-1500 mg daily, Vitamin D 2000 units daily    Orders Placed This Encounter  Procedures  . CBC  . COMPLETE METABOLIC PANEL WITH GFR  . Lipid panel  . TSH    No orders of the defined types were placed in this encounter.  Constitutional:  . VSS, see nurse notes . General Appearance: alert, well-developed, well-nourished, NAD Eyes: Marland Kitchen Normal lids and conjunctive, non-icteric sclera Ears, Nose, Mouth, Throat: . Normal appearance Neck: . No masses, trachea midline . No thyroid enlargement/tenderness/mass appreciated Respiratory: . Normal respiratory effort . Breath sounds normal, no wheeze/rhonchi/rales Cardiovascular: . S1/S2 normal, no murmur/rub/gallop auscultated . No lower extremity edema Gastrointestinal: . Nontender, no masses . No hepatomegaly, no splenomegaly . No hernia appreciated Musculoskeletal:  . Gait normal . No clubbing/cyanosis of digits Neurological: . No cranial nerve deficit on limited exam . Motor and sensation intact and symmetric Psychiatric: . Normal judgment/insight . Normal mood and affect     Follow-up instructions: Return in about 1 year (around 12/18/2020) for [place] (call week prior to visit for lab orders).                                         BP 118/76 (BP Location: Left Arm, Patient Position: Sitting)   Pulse 69   Temp 98 F (36.7 C)   Ht  5\' 8"  (1.727 m)   Wt 188 lb (85.3 kg)   SpO2 99%   BMI 28.59 kg/m   No outpatient medications have been marked as taking for the 12/19/19 encounter (Office Visit) with 02/19/20, DO.    No results found for this or any previous visit (from the past 72 hour(s)).  No results found.     All questions at time of visit were answered - patient instructed to contact office  with any additional concerns or updates.  ER/RTC precautions were reviewed with the patient as applicable.   Please note: voice recognition software was used to produce this document, and typos may escape review. Please contact Dr. Sunnie Nielsen for any needed clarifications.

## 2019-12-20 ENCOUNTER — Other Ambulatory Visit: Payer: Self-pay | Admitting: Osteopathic Medicine

## 2019-12-20 LAB — COMPLETE METABOLIC PANEL WITH GFR
AG Ratio: 1.6 (calc) (ref 1.0–2.5)
ALT: 21 U/L (ref 6–29)
AST: 22 U/L (ref 10–35)
Albumin: 4.2 g/dL (ref 3.6–5.1)
Alkaline phosphatase (APISO): 56 U/L (ref 37–153)
BUN: 21 mg/dL (ref 7–25)
CO2: 29 mmol/L (ref 20–32)
Calcium: 9.3 mg/dL (ref 8.6–10.4)
Chloride: 105 mmol/L (ref 98–110)
Creat: 0.95 mg/dL (ref 0.50–0.99)
GFR, Est African American: 72 mL/min/{1.73_m2} (ref >=60)
GFR, Est Non African American: 62 mL/min/{1.73_m2} (ref >=60)
Globulin: 2.6 g/dL (calc) (ref 1.9–3.7)
Glucose, Bld: 118 mg/dL (ref 65–139)
Potassium: 3.8 mmol/L (ref 3.5–5.3)
Sodium: 139 mmol/L (ref 135–146)
Total Bilirubin: 0.4 mg/dL (ref 0.2–1.2)
Total Protein: 6.8 g/dL (ref 6.1–8.1)

## 2019-12-20 LAB — LIPID PANEL
Cholesterol: 172 mg/dL (ref <200)
HDL: 67 mg/dL (ref >=50)
LDL Cholesterol (Calc): 85 mg/dL (calc)
Non-HDL Cholesterol (Calc): 105 mg/dL (calc) (ref <130)
Total CHOL/HDL Ratio: 2.6 (calc) (ref <5.0)
Triglycerides: 100 mg/dL (ref <150)

## 2019-12-20 LAB — CBC
HCT: 41.4 % (ref 35.0–45.0)
Hemoglobin: 13.7 g/dL (ref 11.7–15.5)
MCH: 29.1 pg (ref 27.0–33.0)
MCHC: 33.1 g/dL (ref 32.0–36.0)
MCV: 87.9 fL (ref 80.0–100.0)
MPV: 10.9 fL (ref 7.5–12.5)
Platelets: 240 10*3/uL (ref 140–400)
RBC: 4.71 10*6/uL (ref 3.80–5.10)
RDW: 13 % (ref 11.0–15.0)
WBC: 4 10*3/uL (ref 3.8–10.8)

## 2019-12-20 LAB — TSH: TSH: 1.58 mIU/L (ref 0.40–4.50)

## 2019-12-20 MED ORDER — LEVOTHYROXINE SODIUM 88 MCG PO TABS: 88.0000 ug | ORAL_TABLET | Freq: Every day | ORAL | 3 refills | Status: DC

## 2019-12-22 ENCOUNTER — Encounter: Payer: Self-pay | Admitting: Osteopathic Medicine

## 2020-08-08 ENCOUNTER — Other Ambulatory Visit: Payer: Self-pay | Admitting: Osteopathic Medicine

## 2020-08-08 DIAGNOSIS — Z1231 Encounter for screening mammogram for malignant neoplasm of breast: Secondary | ICD-10-CM

## 2020-08-13 ENCOUNTER — Ambulatory Visit: Payer: BC Managed Care – PPO | Admitting: Osteopathic Medicine

## 2020-09-26 ENCOUNTER — Ambulatory Visit (INDEPENDENT_AMBULATORY_CARE_PROVIDER_SITE_OTHER): Payer: Medicare Other

## 2020-09-26 ENCOUNTER — Other Ambulatory Visit: Payer: Self-pay

## 2020-09-26 DIAGNOSIS — Z1231 Encounter for screening mammogram for malignant neoplasm of breast: Secondary | ICD-10-CM

## 2020-12-13 ENCOUNTER — Ambulatory Visit (INDEPENDENT_AMBULATORY_CARE_PROVIDER_SITE_OTHER): Payer: Medicare Other | Admitting: Family Medicine

## 2020-12-13 ENCOUNTER — Other Ambulatory Visit: Payer: Self-pay

## 2020-12-13 ENCOUNTER — Encounter: Payer: Self-pay | Admitting: Family Medicine

## 2020-12-13 VITALS — BP 121/74 | HR 76 | Temp 98.2°F | Resp 17

## 2020-12-13 DIAGNOSIS — T63451A Toxic effect of venom of hornets, accidental (unintentional), initial encounter: Secondary | ICD-10-CM | POA: Diagnosis not present

## 2020-12-13 MED ORDER — CLOBETASOL PROPIONATE 0.05 % EX CREA: 1.0000 "application " | TOPICAL_CREAM | Freq: Two times a day (BID) | CUTANEOUS | 2 refills | Status: DC

## 2020-12-13 NOTE — Progress Notes (Signed)
Acute Office Visit  Subjective:    Patient ID: Danielle Galvan, female    DOB: 1956/06/06, 65 y.o.   MRN: [id number]  Chief Complaint  Patient presents with   Insect Bite     HPI Patient is in today for hornet sting.  Patient states she has a hornets nest in her yard. They thought they had sprayed it and killed them all, but she was walking past it on Wednesday and got stung on her left knee. It was starting to improve, but on Saturday she was walking past again and got stung on her left heel. It became very swollen, itchy, started blistering and triggered the sting site from the few days before to get red and itchy again. She has been getting temporary relief from benadryl oral and topical and icing the areas. She tried using some leftover clobetasol cream that dermatologist had given her for poison ivy in the past, but hasn't noticed much difference - states it expired two years ago though. She denies any spreading rashes, fevers, trouble breathing, inflammation, etc.     Past Medical History:  Diagnosis Date   Hypothyroid 06/19/2014   Shingles 11/2013    No past surgical history on file.  Family History  Problem Relation Age of Onset   Prostate cancer Father    Dementia Father     Social History   Socioeconomic History   Marital status: Married    Spouse name: Not on file   Number of children: Not on file   Years of education: Not on file   Highest education level: Not on file  Occupational History   Not on file  Tobacco Use   Smoking status: Never   Smokeless tobacco: Never  Substance and Sexual Activity   Alcohol use: No    Alcohol/week: 0.0 standard drinks   Drug use: No   Sexual activity: Yes    Partners: Male  Other Topics Concern   Not on file  Social History Narrative   Not on file   Social Determinants of Health   Financial Resource Strain: Not on file  Food Insecurity: Not on file  Transportation Needs: Not on file  Physical Activity: Not on file   Stress: Not on file  Social Connections: Not on file  Intimate Partner Violence: Not on file    Outpatient Medications Prior to Visit  Medication Sig Dispense Refill   levothyroxine (EUTHYROX) 88 MCG tablet Take 1 tablet (88 mcg total) by mouth daily before breakfast. 90 tablet 3   No facility-administered medications prior to visit.    Allergies  Allergen Reactions   Fluocinolone Rash   Sulfa Antibiotics Hives and Rash   Ciprofloxacin    Ivy Leaf [Hedera Helix]    Other Rash    Review of Systems All review of systems negative except what is listed in the HPI     Objective:    Physical Exam Vitals reviewed.  Constitutional:      Appearance: Normal appearance.  Skin:    Findings: Rash present.     Comments: L knee with erythema ~1 cm x2 circular areas near sting location; L heel with mild edema, erythema and multiple blisters in linear formation up heel... see pictures below  Neurological:     Mental Status: She is alert.       BP 121/74   Pulse 76   Temp 98.2 F (36.8 C)   Resp 17   SpO2 97%  Wt Readings from Last 3 Encounters:  12/19/19 188 lb (85.3 kg)  11/16/18 184 lb (83.5 kg)  11/03/17 186 lb 14.4 oz (84.8 kg)    Health Maintenance Due  Topic Date Due   Hepatitis C Screening  Never done   PNA vac Low Risk Adult (2 of 2 - PCV13) 11/16/2019   Zoster Vaccines- Shingrix (2 of 2) 11/26/2020    There are no preventive care reminders to display for this patient.   Lab Results  Component Value Date   TSH 1.58 12/19/2019   Lab Results  Component Value Date   WBC 4.0 12/19/2019   HGB 13.7 12/19/2019   HCT 41.4 12/19/2019   MCV 87.9 12/19/2019   PLT 240 12/19/2019   Lab Results  Component Value Date   NA 139 12/19/2019   K 3.8 12/19/2019   CO2 29 12/19/2019   GLUCOSE 118 12/19/2019   BUN 21 12/19/2019   CREATININE 0.95 12/19/2019   BILITOT 0.4 12/19/2019   ALKPHOS 59 04/28/2016   AST 22 12/19/2019   ALT 21 12/19/2019   PROT 6.8  12/19/2019   ALBUMIN 4.5 04/28/2016   CALCIUM 9.3 12/19/2019   Lab Results  Component Value Date   CHOL 172 12/19/2019   Lab Results  Component Value Date   HDL 67 12/19/2019   Lab Results  Component Value Date   LDLCALC 85 12/19/2019   Lab Results  Component Value Date   TRIG 100 12/19/2019   Lab Results  Component Value Date   CHOLHDL 2.6 12/19/2019   No results found for: HGBA1C     Assessment & Plan:   1. Toxic reaction to hornets, wasps and bees, accidental or unintentional, initial encounter Almost 1 week since stings and continuing to itch/burn with edema and erythema and only mild improvement with benadryl. Will go ahead and send in a new prescription for her clobetasol to try. If not improving or if worsening please follow-up. I do not think oral steroids are necessary at this time. Patient aware of signs/symptoms requiring further/urgent evaluation.  - clobetasol cream (TEMOVATE) 0.05 %; Apply 1 application topically 2 (two) times daily.  Dispense: 60 g; Refill: 2  Follow-up as needed.    Lollie Marrow Reola Calkins, DNP, FNP-C

## 2020-12-24 ENCOUNTER — Ambulatory Visit: Payer: BC Managed Care – PPO | Admitting: Osteopathic Medicine

## 2020-12-31 ENCOUNTER — Ambulatory Visit: Payer: BC Managed Care – PPO | Admitting: Osteopathic Medicine

## 2021-01-07 ENCOUNTER — Encounter: Payer: Self-pay | Admitting: Osteopathic Medicine

## 2021-01-07 ENCOUNTER — Other Ambulatory Visit: Payer: Self-pay

## 2021-01-07 ENCOUNTER — Ambulatory Visit (INDEPENDENT_AMBULATORY_CARE_PROVIDER_SITE_OTHER): Payer: Medicare Other | Admitting: Osteopathic Medicine

## 2021-01-07 VITALS — BP 113/75 | HR 73 | Temp 98.0°F | Wt 189.1 lb

## 2021-01-07 DIAGNOSIS — M899 Disorder of bone, unspecified: Secondary | ICD-10-CM

## 2021-01-07 DIAGNOSIS — M8580 Other specified disorders of bone density and structure, unspecified site: Secondary | ICD-10-CM | POA: Diagnosis not present

## 2021-01-07 DIAGNOSIS — Z0000 Encounter for general adult medical examination without abnormal findings: Secondary | ICD-10-CM

## 2021-01-07 DIAGNOSIS — E039 Hypothyroidism, unspecified: Secondary | ICD-10-CM

## 2021-01-07 NOTE — Progress Notes (Signed)
HPI: Danielle Galvan is a 65 y.o. female  who presents to [facility] today, 01/07/21,  for Medicare Annual Wellness Exam  Patient presents for annual physical/Medicare wellness exam. no complaints today. Here to discuss routine preventive care and to monitor chronic conditions   Past medical, surgical, social and family history reviewed:  Patient Active Problem List   Diagnosis Date Noted   Preventive measure 06/28/2014   Hypothyroid 06/19/2014    No past surgical history on file.  Social History   Socioeconomic History   Marital status: Married    Spouse name: Not on file   Number of children: Not on file   Years of education: Not on file   Highest education level: Not on file  Occupational History   Not on file  Tobacco Use   Smoking status: Never   Smokeless tobacco: Never  Substance and Sexual Activity   Alcohol use: No    Alcohol/week: 0.0 standard drinks   Drug use: No   Sexual activity: Yes    Partners: Male  Other Topics Concern   Not on file  Social History Narrative   Not on file   Social Determinants of Health   Financial Resource Strain: Not on file  Food Insecurity: Not on file  Transportation Needs: Not on file  Physical Activity: Not on file  Stress: Not on file  Social Connections: Not on file  Intimate Partner Violence: Not on file    Family History  Problem Relation Age of Onset   Prostate cancer Father    Dementia Father      Current medication list and allergy/intolerance information reviewed:    Outpatient Encounter Medications as of 01/07/2021  Medication Sig   clobetasol cream (TEMOVATE) 0.05 % Apply 1 application topically 2 (two) times daily.   EPINEPHrine 0.3 mg/0.3 mL IJ SOAJ injection SMARTSIG:1 Pre-Filled Pen Syringe IM PRN   levothyroxine (EUTHYROX) 88 MCG tablet Take 1 tablet (88 mcg total) by mouth daily before breakfast.   No facility-administered encounter medications on file as of  01/07/2021.    Allergies  Allergen Reactions   Fluocinolone Rash   Sulfa Antibiotics Hives and Rash   Ciprofloxacin    Ivy Leaf [Hedera Helix]    Other Rash       Review of Systems: Review of Systems - History obtained from the patient General ROS: negative Patient feeling well today    Medicare Wellness Questionnaire  Are there smokers in your home (other than you)? no  Depression Screen (Note: if answer to either of the following is "Yes", a more complete depression screening is indicated)   Q1: Over the past two weeks, have you felt down, depressed or hopeless? no  Q2: Over the past two weeks, have you felt little interest or pleasure in doing things? no  Have you lost interest or pleasure in daily life? no  Do you often feel hopeless? no  Do you cry easily over simple problems? no  Activities of Daily Living In your present state of health, do you have any difficulty performing the following activities?:  Driving? no Managing money?  no Feeding yourself? no Getting from bed to chair? no Climbing a flight of stairs? no Preparing food and eating?: no Bathing or showering? no Getting dressed: no Getting to the toilet? no Using the toilet: no Moving around from place to place: no In the past year have you fallen or had a near fall?: no  Hearing Difficulties:  Do  you often ask people to speak up or repeat themselves? no Do you experience ringing or noises in your ears? no  Do you have difficulty understanding soft or whispered voices? no  Memory Difficulties:  Do you feel that you have a problem with memory? no  Do you often misplace items? no  Do you feel safe at home?  yes  Sexual Health:   Are you sexually active?  Yes  Do you have more than one partner?  No  Advanced Directives:   Advanced directives discussed: has an advanced directive - a copy HAS NOT been provided.  Additional information provided: no  Risk Factors  Current exercise habits:  treadmill, 2.5-3 miles per day most days of the week  Dietary issues discussed:no concerns, doing well and eating healthy, no constipation anymore   Cardiac risk factors: positive family history   Exam:  BP 113/75 (BP Location: Left Arm, Patient Position: Sitting, Cuff Size: Normal)   Pulse 73   Temp 98 F (36.7 C) (Oral)   Wt 189 lb 1.3 oz (85.8 kg)   BMI 28.75 kg/m  Vision by Snellen chart: right eye:see nurse notes, left eye:see nurse notes Constitutional: VS see above. General Appearance: alert, well-developed, well-nourished, NAD Ears, Nose, Mouth, Throat: MMM Neck: No masses, trachea midline.  Respiratory: Normal respiratory effort. no wheeze, no rhonchi, no rales Cardiovascular:No lower extremity edema.  Musculoskeletal: Gait normal. No clubbing/cyanosis of digits.  Neurological: Normal balance/coordination. No tremor. Recalls 3 objects and able to read face of watch with correct time.  Skin: warm, dry, intact. No rash/ulcer.  Psychiatric: Normal judgment/insight. Normal mood and affect. Oriented x3.     ASSESSMENT/PLAN:   Encounter for Medicare annual wellness exam  Annual physical exam - Plan: CBC, COMPLETE METABOLIC PANEL WITH GFR, Lipid panel, TSH  Hypothyroidism, unspecified type - Plan: CBC, COMPLETE METABOLIC PANEL WITH GFR, Lipid panel, TSH  Osteopenia, unspecified location - Plan: CBC, COMPLETE METABOLIC PANEL WITH GFR, Lipid panel, TSH  Patient Instructions  General Preventive Care Most recent routine screening labs: ordered. Blood pressure goal 130/80 or less. Tobacco: don't! Alcohol: responsible moderation is ok for most adults - if you have concerns about your alcohol intake, please talk to me! Exercise: as tolerated to reduce risk of cardiovascular disease and diabetes. Strength training will also prevent osteoporosis. Mental health: if need for mental health care (medicines, counseling, other), or concerns about moods, please let me know! Sexual /  Reproductive health: if need for STD testing, or if concerns with libido/pain problems, please let me know!  Advanced Directive: Living Will and/or Healthcare Power of Attorney recommended for all adults, regardless of age or health. Vaccines Flu vaccine: for almost everyone, every fall. Shingles vaccine: if you've had two shots at the pharmacy, you're all set!  Pneumonia vaccine: done. Tetanus booster: every 10 years, due 2026 COVID vaccine: THANKS for getting your vaccine! :) Cancer screenings Colon cancer screening: Colonoscopy done 2019 and due again in 2024 Breast cancer screening: mammogram annually after age 24. Cervical cancer screening: Can stop at age 46 if previous results normal Lung cancer screening: not needed for non-smokers Infection screenings HIV: recommended screening at least once age 60-65 Gonorrhea/Chlamydia: screening as needed Hepatitis C: recommended once for everyone age 49-75 TB: certain at-risk populations, or depending on work requirements and/or travel history Other Bone Density Test: recheck due 2022. Calcium 1300-1500 mg daily, Vitamin D 2000 units daily   During the course of the visit the patient was educated and counseled  about appropriate screening and preventive services as noted above.   Patient Instructions (the written plan) was given to the patient.  Medicare Attestation I have personally reviewed: The patient's medical and social history Their use of alcohol, tobacco or illicit drugs Their current medications and supplements The patient's functional ability including ADLs,fall risks, home safety risks, cognitive, and hearing and visual impairment Diet and physical activities Evidence for depression or mood disorders  The patient's weight, height, BMI, and visual acuity have been recorded in the chart.  I have made referrals, counseling, and provided education to the patient based on review of the above and I have provided the patient with a  written personalized care plan for preventive services.     Sunnie Nielsen, DO   01/07/21   Visit summary with medication list and pertinent instructions was printed for patient to review. All questions at time of visit were answered - patient instructed to contact office with any additional concerns. ER/RTC precautions were reviewed with the patient. Follow-up plan: Return in about 1 year (around 01/07/2022) for [place] (call week prior to visit for lab orders).

## 2021-01-07 NOTE — Patient Instructions (Addendum)
General Preventive Care Most recent routine screening labs: ordered. Blood pressure goal 130/80 or less. Tobacco: don't! Alcohol: responsible moderation is ok for most adults - if you have concerns about your alcohol intake, please talk to me! Exercise: as tolerated to reduce risk of cardiovascular disease and diabetes. Strength training will also prevent osteoporosis. Mental health: if need for mental health care (medicines, counseling, other), or concerns about moods, please let me know! Sexual / Reproductive health: if need for STD testing, or if concerns with libido/pain problems, please let me know!  Advanced Directive: Living Will and/or Healthcare Power of Attorney recommended for all adults, regardless of age or health. Vaccines Flu vaccine: for almost everyone, every fall. Shingles vaccine: if you've had two shots at the pharmacy, you're all set!  Pneumonia vaccine: done. Tetanus booster: every 10 years, due 2026 COVID vaccine: THANKS for getting your vaccine! :) Cancer screenings Colon cancer screening: Colonoscopy done 2019 and due again in 2024 Breast cancer screening: mammogram annually after age 31. Cervical cancer screening: Can stop at age 29 if previous results normal Lung cancer screening: not needed for non-smokers Infection screenings HIV: recommended screening at least once age 46-65 Gonorrhea/Chlamydia: screening as needed Hepatitis C: recommended once for everyone age 26-75 TB: certain at-risk populations, or depending on work requirements and/or travel history Other Bone Density Test: recheck due 2022. Calcium 1300-1500 mg daily, Vitamin D 2000 units daily

## 2021-01-08 LAB — CBC
HCT: 44.6 % (ref 35.0–45.0)
Hemoglobin: 14.7 g/dL (ref 11.7–15.5)
MCH: 29.3 pg (ref 27.0–33.0)
MCHC: 33 g/dL (ref 32.0–36.0)
MCV: 89 fL (ref 80.0–100.0)
MPV: 10.9 fL (ref 7.5–12.5)
Platelets: 231 10*3/uL (ref 140–400)
RBC: 5.01 10*6/uL (ref 3.80–5.10)
RDW: 13.1 % (ref 11.0–15.0)
WBC: 3.9 10*3/uL (ref 3.8–10.8)

## 2021-01-08 LAB — COMPLETE METABOLIC PANEL WITH GFR
AG Ratio: 1.6 (calc) (ref 1.0–2.5)
ALT: 21 U/L (ref 6–29)
AST: 21 U/L (ref 10–35)
Albumin: 4.5 g/dL (ref 3.6–5.1)
Alkaline phosphatase (APISO): 58 U/L (ref 37–153)
BUN/Creatinine Ratio: 16 (calc) (ref 6–22)
BUN: 19 mg/dL (ref 7–25)
CO2: 30 mmol/L (ref 20–32)
Calcium: 10.4 mg/dL (ref 8.6–10.4)
Chloride: 102 mmol/L (ref 98–110)
Creat: 1.16 mg/dL — ABNORMAL HIGH (ref 0.50–1.05)
Globulin: 2.8 g/dL (calc) (ref 1.9–3.7)
Glucose, Bld: 95 mg/dL (ref 65–99)
Potassium: 4.7 mmol/L (ref 3.5–5.3)
Sodium: 139 mmol/L (ref 135–146)
Total Bilirubin: 0.5 mg/dL (ref 0.2–1.2)
Total Protein: 7.3 g/dL (ref 6.1–8.1)
eGFR: 52 mL/min/{1.73_m2} — ABNORMAL LOW (ref >=60)

## 2021-01-08 LAB — LIPID PANEL
Cholesterol: 205 mg/dL — ABNORMAL HIGH (ref <200)
HDL: 68 mg/dL (ref >=50)
LDL Cholesterol (Calc): 110 mg/dL (calc) — ABNORMAL HIGH
Non-HDL Cholesterol (Calc): 137 mg/dL (calc) — ABNORMAL HIGH (ref <130)
Total CHOL/HDL Ratio: 3 (calc) (ref <5.0)
Triglycerides: 157 mg/dL — ABNORMAL HIGH (ref <150)

## 2021-01-08 LAB — TSH: TSH: 3.16 mIU/L (ref 0.40–4.50)

## 2021-01-10 ENCOUNTER — Encounter: Payer: Self-pay | Admitting: Osteopathic Medicine

## 2021-02-24 ENCOUNTER — Other Ambulatory Visit: Payer: Self-pay | Admitting: Osteopathic Medicine

## 2021-04-17 ENCOUNTER — Other Ambulatory Visit: Payer: Self-pay

## 2021-04-17 DIAGNOSIS — N289 Disorder of kidney and ureter, unspecified: Secondary | ICD-10-CM

## 2021-04-17 NOTE — Progress Notes (Signed)
Patient left a vm msg requesting a lab order for bmp check. Per patient, previous provider (Dr. Lyn Hollingshead) requested the recheck for mild kidney disease. Patient informed that lab order was placed.

## 2021-04-18 LAB — BASIC METABOLIC PANEL WITH GFR
BUN: 20 mg/dL (ref 7–25)
CO2: 29 mmol/L (ref 20–32)
Calcium: 10 mg/dL (ref 8.6–10.4)
Chloride: 99 mmol/L (ref 98–110)
Creat: 1.02 mg/dL (ref 0.50–1.05)
Glucose, Bld: 107 mg/dL — ABNORMAL HIGH (ref 65–99)
Potassium: 4.1 mmol/L (ref 3.5–5.3)
Sodium: 135 mmol/L (ref 135–146)
eGFR: 60 mL/min/{1.73_m2} (ref >=60)

## 2021-05-07 ENCOUNTER — Encounter: Payer: Self-pay | Admitting: Family Medicine

## 2021-05-07 ENCOUNTER — Ambulatory Visit (INDEPENDENT_AMBULATORY_CARE_PROVIDER_SITE_OTHER): Payer: Medicare Other | Admitting: Family Medicine

## 2021-05-07 ENCOUNTER — Other Ambulatory Visit: Payer: Self-pay

## 2021-05-07 VITALS — BP 126/72 | HR 100 | Temp 97.4°F | Ht 68.0 in | Wt 194.0 lb

## 2021-05-07 DIAGNOSIS — R7989 Other specified abnormal findings of blood chemistry: Secondary | ICD-10-CM

## 2021-05-07 DIAGNOSIS — E039 Hypothyroidism, unspecified: Secondary | ICD-10-CM

## 2021-05-08 ENCOUNTER — Encounter: Payer: Self-pay | Admitting: Family Medicine

## 2021-05-08 DIAGNOSIS — R7989 Other specified abnormal findings of blood chemistry: Secondary | ICD-10-CM | POA: Insufficient documentation

## 2021-05-08 NOTE — Assessment & Plan Note (Signed)
Recheck renal function. ?

## 2021-05-08 NOTE — Assessment & Plan Note (Signed)
Recent TSH within normal limits.  She will continue levothyroxine at current strength.

## 2021-05-08 NOTE — Progress Notes (Signed)
Danielle Galvan - 65 y.o. female MRN [id number]  Date of birth: 09-07-1955  Subjective Chief Complaint  Patient presents with   Establish Care    HPI Danielle Galvan is a 65 year old female here today for follow-up visit.  She is a former patient of Dr. Lyn Hollingshead.  She has been fairly healthy.  She does have a history of hypothyroidism and is currently on levothyroxine.  She has felt good at current dose of levothyroxine.  She did have mildly elevated creatinine and blood glucose on previous labs that we will need to recheck.  She does feel like she may have been a little dehydrated the day that she had this.  ROS:  A comprehensive ROS was completed and negative except as noted per HPI  Allergies  Allergen Reactions   Sulfa Antibiotics Hives and Rash   Ciprofloxacin    Ivy Leaf [Hedera Helix]    Other Rash    Past Medical History:  Diagnosis Date   Hypothyroid 06/19/2014   Shingles 11/2013    No past surgical history on file.  Social History   Socioeconomic History   Marital status: Married    Spouse name: Not on file   Number of children: Not on file   Years of education: Not on file   Highest education level: Not on file  Occupational History   Not on file  Tobacco Use   Smoking status: Never   Smokeless tobacco: Never  Substance and Sexual Activity   Alcohol use: No    Alcohol/week: 0.0 standard drinks   Drug use: No   Sexual activity: Yes    Partners: Male  Other Topics Concern   Not on file  Social History Narrative   Not on file   Social Determinants of Health   Financial Resource Strain: Not on file  Food Insecurity: Not on file  Transportation Needs: Not on file  Physical Activity: Not on file  Stress: Not on file  Social Connections: Not on file    Family History  Problem Relation Age of Onset   Prostate cancer Father    Dementia Father     Health Maintenance  Topic Date Due   Hepatitis C Screening  Never done   Pneumonia Vaccine 8+ Years old (2 -  PCV) 11/16/2019   COVID-19 Vaccine (5 - Booster for Pfizer series) 11/26/2020   Zoster Vaccines- Shingrix (2 of 2) 11/26/2020   INFLUENZA VACCINE  01/13/2021   MAMMOGRAM  09/26/2021   COLONOSCOPY (Pts 45-46yrs Insurance coverage will need to be confirmed)  01/28/2023   TETANUS/TDAP  04/22/2025   DEXA SCAN  Completed   HPV VACCINES  Aged Out     ----------------------------------------------------------------------------------------------------------------------------------------------------------------------------------------------------------------- Physical Exam BP 126/72 (BP Location: Left Arm, Patient Position: Sitting, Cuff Size: Normal)   Pulse 100   Temp (!) 97.4 F (36.3 C)   Ht 5\' 8"  (1.727 m)   Wt 194 lb (88 kg)   SpO2 97%   BMI 29.50 kg/m   Physical Exam Constitutional:      Appearance: Normal appearance.  HENT:     Head: Normocephalic and atraumatic.  Eyes:     General: No scleral icterus. Cardiovascular:     Rate and Rhythm: Normal rate and regular rhythm.  Pulmonary:     Effort: Pulmonary effort is normal.     Breath sounds: Normal breath sounds.  Musculoskeletal:     Cervical back: Neck supple.  Neurological:     General: No focal deficit present.     Mental  Status: She is alert.  Psychiatric:        Mood and Affect: Mood normal.        Behavior: Behavior normal.    ------------------------------------------------------------------------------------------------------------------------------------------------------------------------------------------------------------------- Assessment and Plan  Hypothyroid Recent TSH within normal limits.  She will continue levothyroxine at current strength.  Elevated serum creatinine Recheck renal function.   No orders of the defined types were placed in this encounter.   Return in about 6 months (around 11/04/2021) for Hypothyroid.    This visit occurred during the SARS-CoV-2 public health emergency.   Safety protocols were in place, including screening questions prior to the visit, additional usage of staff PPE, and extensive cleaning of exam room while observing appropriate contact time as indicated for disinfecting solutions.

## 2021-05-21 ENCOUNTER — Other Ambulatory Visit: Payer: Self-pay | Admitting: Osteopathic Medicine

## 2021-05-22 LAB — BASIC METABOLIC PANEL
BUN: 15 mg/dL (ref 7–25)
CO2: 26 mmol/L (ref 20–32)
Calcium: 9.6 mg/dL (ref 8.6–10.4)
Chloride: 101 mmol/L (ref 98–110)
Creat: 0.99 mg/dL (ref 0.50–1.05)
Glucose, Bld: 100 mg/dL — ABNORMAL HIGH (ref 65–99)
Potassium: 4.4 mmol/L (ref 3.5–5.3)
Sodium: 137 mmol/L (ref 135–146)

## 2021-05-23 ENCOUNTER — Telehealth: Payer: Self-pay

## 2021-05-23 ENCOUNTER — Other Ambulatory Visit: Payer: Self-pay

## 2021-05-23 DIAGNOSIS — E039 Hypothyroidism, unspecified: Secondary | ICD-10-CM

## 2021-05-23 NOTE — Telephone Encounter (Signed)
Routing to covering provider:  [facility] pharmacy sent a notification to provider regarding levothyroxine rx. Per [facility], "manufacturer change required because of back order issues".

## 2021-05-23 NOTE — Telephone Encounter (Signed)
Ok recheck labs in 6 weeks and make pt aware of switch.

## 2021-05-23 NOTE — Telephone Encounter (Signed)
Task completed. Patient informed of covering provider's note and recommendation. Agreeable with current plan. Lab order for thyroid testing ordered to be completed in 6 weeks.

## 2021-05-23 NOTE — Progress Notes (Signed)
Labs ordered for thyroid recheck.

## 2021-07-08 ENCOUNTER — Telehealth: Payer: Self-pay

## 2021-07-08 NOTE — Telephone Encounter (Signed)
Pt lvm requesting labs for TSH to switch medication.   Per previous notes, labs are pending. Switching medication due to manufacturer backlog.   Returned pt's call and advised on lab.

## 2021-07-09 LAB — TSH: TSH: 1.96 mIU/L (ref 0.40–4.50)

## 2021-07-11 NOTE — Progress Notes (Signed)
Thyroid looks great.

## 2021-08-13 ENCOUNTER — Other Ambulatory Visit: Payer: Self-pay | Admitting: Family Medicine

## 2021-08-13 DIAGNOSIS — Z1231 Encounter for screening mammogram for malignant neoplasm of breast: Secondary | ICD-10-CM

## 2021-08-22 ENCOUNTER — Other Ambulatory Visit: Payer: Self-pay

## 2021-09-17 ENCOUNTER — Encounter: Payer: Self-pay | Admitting: Family Medicine

## 2021-10-16 ENCOUNTER — Ambulatory Visit (INDEPENDENT_AMBULATORY_CARE_PROVIDER_SITE_OTHER): Payer: Medicare PPO

## 2021-10-16 DIAGNOSIS — Z1231 Encounter for screening mammogram for malignant neoplasm of breast: Secondary | ICD-10-CM | POA: Diagnosis not present

## 2021-11-04 ENCOUNTER — Ambulatory Visit: Payer: Medicare PPO | Admitting: Family Medicine

## 2021-11-20 ENCOUNTER — Other Ambulatory Visit: Payer: Self-pay | Admitting: Family Medicine

## 2021-12-04 ENCOUNTER — Encounter: Payer: Self-pay | Admitting: Family Medicine

## 2021-12-04 ENCOUNTER — Ambulatory Visit: Payer: Medicare PPO | Admitting: Family Medicine

## 2021-12-04 VITALS — BP 112/71 | HR 57 | Ht 68.0 in | Wt 186.0 lb

## 2021-12-04 DIAGNOSIS — R5383 Other fatigue: Secondary | ICD-10-CM | POA: Diagnosis not present

## 2021-12-04 DIAGNOSIS — E039 Hypothyroidism, unspecified: Secondary | ICD-10-CM

## 2021-12-04 NOTE — Assessment & Plan Note (Signed)
Update TSH today. 

## 2021-12-04 NOTE — Progress Notes (Signed)
Danielle Galvan - 66 y.o. female MRN [id number]  Date of birth: 1955-08-08  Subjective Chief Complaint  Patient presents with   Hypothyroidism    HPI Danielle Galvan is a 66 y.o. female here today for follow up visit.  She reports that she is doing pretty well. She has had some fatigue over the past several months, this is better at this point.  She is taking levothyroxine regularly, feels good at current dose.   Family history of dementia and she is concerned about developing this.  She is trying to stay active and keep her mind active.    ROS:  A comprehensive ROS was completed and negative except as noted per HPI  Allergies  Allergen Reactions   Sulfa Antibiotics Hives and Rash   Ciprofloxacin    Ivy Leaf [Hedera Helix]    Other Rash    Past Medical History:  Diagnosis Date   Hypothyroid 06/19/2014   Shingles 11/2013    History reviewed. No pertinent surgical history.  Social History   Socioeconomic History   Marital status: Married    Spouse name: Not on file   Number of children: Not on file   Years of education: Not on file   Highest education level: Not on file  Occupational History   Not on file  Tobacco Use   Smoking status: Never   Smokeless tobacco: Never  Substance and Sexual Activity   Alcohol use: No    Alcohol/week: 0.0 standard drinks of alcohol   Drug use: No   Sexual activity: Yes    Partners: Male  Other Topics Concern   Not on file  Social History Narrative   Not on file   Social Determinants of Health   Financial Resource Strain: Not on file  Food Insecurity: Not on file  Transportation Needs: Not on file  Physical Activity: Not on file  Stress: Not on file  Social Connections: Not on file    Family History  Problem Relation Age of Onset   Prostate cancer Father    Dementia Father     Health Maintenance  Topic Date Due   Hepatitis C Screening  12/05/2022 (Originally 09/14/1973)   INFLUENZA VACCINE  01/13/2022   COVID-19 Vaccine (6  - Pfizer series) 04/05/2022   MAMMOGRAM  10/17/2022   COLONOSCOPY (Pts 45-14yrs Insurance coverage will need to be confirmed)  01/28/2023   TETANUS/TDAP  04/22/2025   Pneumonia Vaccine 12+ Years old  Completed   DEXA SCAN  Completed   Zoster Vaccines- Shingrix  Completed   HPV VACCINES  Aged Out     ----------------------------------------------------------------------------------------------------------------------------------------------------------------------------------------------------------------- Physical Exam BP 112/71 (BP Location: Left Arm, Patient Position: Sitting, Cuff Size: Normal)   Pulse (!) 57   Ht 5\' 8"  (1.727 m)   Wt 186 lb (84.4 kg)   SpO2 96%   BMI 28.28 kg/m   Physical Exam Constitutional:      Appearance: Normal appearance.  Eyes:     General: No scleral icterus. Neurological:     Mental Status: She is alert.  Psychiatric:        Mood and Affect: Mood normal.        Behavior: Behavior normal.     ------------------------------------------------------------------------------------------------------------------------------------------------------------------------------------------------------------------- Assessment and Plan  Hypothyroid Update TSH today.   Other fatigue Orders Placed This Encounter  Procedures   TSH + free T4   COMPLETE METABOLIC PANEL WITH GFR   CBC with Differential   Lipid Panel w/reflex Direct LDL   B12   Iron, TIBC  and Ferritin Panel     No orders of the defined types were placed in this encounter.   No follow-ups on file.    This visit occurred during the SARS-CoV-2 public health emergency.  Safety protocols were in place, including screening questions prior to the visit, additional usage of staff PPE, and extensive cleaning of exam room while observing appropriate contact time as indicated for disinfecting solutions.

## 2021-12-04 NOTE — Assessment & Plan Note (Signed)
Orders Placed This Encounter  Procedures  . TSH + free T4  . COMPLETE METABOLIC PANEL WITH GFR  . CBC with Differential  . Lipid Panel w/reflex Direct LDL  . B12  . Iron, TIBC and Ferritin Panel

## 2021-12-04 NOTE — Patient Instructions (Signed)
Nice to see you today! Have labs completed when fasting

## 2021-12-06 LAB — TSH+FREE T4: TSH W/REFLEX TO FT4: 4.58 mIU/L — ABNORMAL HIGH (ref 0.40–4.50)

## 2021-12-06 LAB — CBC WITH DIFFERENTIAL/PLATELET
Absolute Monocytes: 435 cells/uL (ref 200–950)
Basophils Absolute: 41 cells/uL (ref 0–200)
Basophils Relative: 1 %
Eosinophils Absolute: 82 cells/uL (ref 15–500)
Eosinophils Relative: 2 %
HCT: 41.2 % (ref 35.0–45.0)
Hemoglobin: 14.3 g/dL (ref 11.7–15.5)
Lymphs Abs: 779 cells/uL — ABNORMAL LOW (ref 850–3900)
MCH: 30.2 pg (ref 27.0–33.0)
MCHC: 34.7 g/dL (ref 32.0–36.0)
MCV: 86.9 fL (ref 80.0–100.0)
MPV: 11.2 fL (ref 7.5–12.5)
Monocytes Relative: 10.6 %
Neutro Abs: 2763 cells/uL (ref 1500–7800)
Neutrophils Relative %: 67.4 %
Platelets: 214 10*3/uL (ref 140–400)
RBC: 4.74 10*6/uL (ref 3.80–5.10)
RDW: 13.2 % (ref 11.0–15.0)
Total Lymphocyte: 19 %
WBC: 4.1 10*3/uL (ref 3.8–10.8)

## 2021-12-06 LAB — COMPLETE METABOLIC PANEL WITH GFR
AG Ratio: 1.4 (calc) (ref 1.0–2.5)
ALT: 17 U/L (ref 6–29)
AST: 18 U/L (ref 10–35)
Albumin: 4.2 g/dL (ref 3.6–5.1)
Alkaline phosphatase (APISO): 64 U/L (ref 37–153)
BUN: 19 mg/dL (ref 7–25)
CO2: 28 mmol/L (ref 20–32)
Calcium: 9.6 mg/dL (ref 8.6–10.4)
Chloride: 102 mmol/L (ref 98–110)
Creat: 1 mg/dL (ref 0.50–1.05)
Globulin: 2.9 g/dL (calc) (ref 1.9–3.7)
Glucose, Bld: 93 mg/dL (ref 65–99)
Potassium: 4.7 mmol/L (ref 3.5–5.3)
Sodium: 138 mmol/L (ref 135–146)
Total Bilirubin: 0.6 mg/dL (ref 0.2–1.2)
Total Protein: 7.1 g/dL (ref 6.1–8.1)
eGFR: 61 mL/min/{1.73_m2} (ref >=60)

## 2021-12-06 LAB — VITAMIN B12: Vitamin B-12: 1145 pg/mL — ABNORMAL HIGH (ref 200–1100)

## 2021-12-06 LAB — IRON,TIBC AND FERRITIN PANEL
%SAT: 24 % (calc) (ref 16–45)
Ferritin: 78 ng/mL (ref 16–288)
Iron: 77 ug/dL (ref 45–160)
TIBC: 323 mcg/dL (calc) (ref 250–450)

## 2021-12-06 LAB — LIPID PANEL W/REFLEX DIRECT LDL
Cholesterol: 193 mg/dL (ref <200)
HDL: 69 mg/dL (ref >=50)
LDL Cholesterol (Calc): 107 mg/dL (calc) — ABNORMAL HIGH
Non-HDL Cholesterol (Calc): 124 mg/dL (calc) (ref <130)
Total CHOL/HDL Ratio: 2.8 (calc) (ref <5.0)
Triglycerides: 81 mg/dL (ref <150)

## 2021-12-06 LAB — T4, FREE: Free T4: 1.1 ng/dL (ref 0.8–1.8)

## 2021-12-09 ENCOUNTER — Other Ambulatory Visit: Payer: Self-pay | Admitting: Family Medicine

## 2021-12-09 DIAGNOSIS — E039 Hypothyroidism, unspecified: Secondary | ICD-10-CM

## 2021-12-09 MED ORDER — LEVOTHYROXINE SODIUM 100 MCG PO TABS: 100.0000 ug | ORAL_TABLET | Freq: Every day | ORAL | 0 refills | Status: DC

## 2021-12-29 ENCOUNTER — Other Ambulatory Visit: Payer: Self-pay

## 2021-12-29 MED ORDER — LEVOTHYROXINE SODIUM 100 MCG PO TABS: 100.0000 ug | ORAL_TABLET | Freq: Every day | ORAL | 0 refills | Status: DC

## 2022-01-13 DIAGNOSIS — Z129 Encounter for screening for malignant neoplasm, site unspecified: Secondary | ICD-10-CM | POA: Diagnosis not present

## 2022-01-13 DIAGNOSIS — D2361 Other benign neoplasm of skin of right upper limb, including shoulder: Secondary | ICD-10-CM | POA: Diagnosis not present

## 2022-01-13 DIAGNOSIS — L821 Other seborrheic keratosis: Secondary | ICD-10-CM | POA: Diagnosis not present

## 2022-01-13 DIAGNOSIS — Z85828 Personal history of other malignant neoplasm of skin: Secondary | ICD-10-CM | POA: Diagnosis not present

## 2022-01-14 DIAGNOSIS — J3089 Other allergic rhinitis: Secondary | ICD-10-CM | POA: Diagnosis not present

## 2022-01-21 DIAGNOSIS — J3089 Other allergic rhinitis: Secondary | ICD-10-CM | POA: Diagnosis not present

## 2022-01-28 ENCOUNTER — Encounter: Payer: Self-pay | Admitting: Family Medicine

## 2022-01-28 ENCOUNTER — Ambulatory Visit (INDEPENDENT_AMBULATORY_CARE_PROVIDER_SITE_OTHER): Payer: Medicare PPO | Admitting: Family Medicine

## 2022-01-28 VITALS — BP 118/71 | HR 69 | Ht 68.0 in | Wt 189.0 lb

## 2022-01-28 DIAGNOSIS — E039 Hypothyroidism, unspecified: Secondary | ICD-10-CM | POA: Diagnosis not present

## 2022-01-28 DIAGNOSIS — J3089 Other allergic rhinitis: Secondary | ICD-10-CM | POA: Diagnosis not present

## 2022-01-28 DIAGNOSIS — Z0000 Encounter for general adult medical examination without abnormal findings: Secondary | ICD-10-CM | POA: Diagnosis not present

## 2022-01-28 NOTE — Assessment & Plan Note (Signed)
Well adult Orders Placed This Encounter  Procedures  . TSH  Screenings: Up-to-date Immunizations: Up-to-date Anticipatory guidance/risk factor reduction: Recommendations per AVS

## 2022-01-28 NOTE — Progress Notes (Signed)
Danielle Galvan - 66 y.o. female MRN [id number]  Date of birth: 12-03-1955  Subjective Chief Complaint  Patient presents with   Annual Exam    HPI Danielle Galvan is a 66 year old female here today for annual exam.  She reports she is doing well at this time.  Adjustment to levothyroxine previously has helped tremendously in her energy levels.  She does try to stay active.  Feels like her diet is pretty good.  She is a non-smoker.  She denies alcohol use at this time.  Immunizations are up-to-date  Review of Systems  Constitutional:  Negative for chills, fever, malaise/fatigue and weight loss.  HENT:  Negative for congestion, ear pain and sore throat.   Eyes:  Negative for blurred vision, double vision and pain.  Respiratory:  Negative for cough and shortness of breath.   Cardiovascular:  Negative for chest pain and palpitations.  Gastrointestinal:  Negative for abdominal pain, blood in stool, constipation, heartburn and nausea.  Genitourinary:  Negative for dysuria and urgency.  Musculoskeletal:  Negative for joint pain and myalgias.  Neurological:  Negative for dizziness and headaches.  Endo/Heme/Allergies:  Does not bruise/bleed easily.  Psychiatric/Behavioral:  Negative for depression. The patient is not nervous/anxious and does not have insomnia.     Allergies  Allergen Reactions   Sulfa Antibiotics Hives and Rash   Ciprofloxacin    Ivy Leaf [Hedera Helix]    Other Rash    Past Medical History:  Diagnosis Date   Hypothyroid 06/19/2014   Shingles 11/2013    History reviewed. No pertinent surgical history.  Social History   Socioeconomic History   Marital status: Married    Spouse name: Not on file   Number of children: Not on file   Years of education: Not on file   Highest education level: Not on file  Occupational History   Not on file  Tobacco Use   Smoking status: Never   Smokeless tobacco: Never  Substance and Sexual Activity   Alcohol use: No     Alcohol/week: 0.0 standard drinks of alcohol   Drug use: No   Sexual activity: Yes    Partners: Male  Other Topics Concern   Not on file  Social History Narrative   Not on file   Social Determinants of Health   Financial Resource Strain: Not on file  Food Insecurity: Not on file  Transportation Needs: Not on file  Physical Activity: Not on file  Stress: Not on file  Social Connections: Not on file    Family History  Problem Relation Age of Onset   Prostate cancer Father    Dementia Father     Health Maintenance  Topic Date Due   INFLUENZA VACCINE  09/13/2022 (Originally 01/13/2022)   Hepatitis C Screening  12/05/2022 (Originally 09/14/1973)   COVID-19 Vaccine (6 - Pfizer series) 04/05/2022   MAMMOGRAM  10/17/2022   COLONOSCOPY (Pts 45-42yrs Insurance coverage will need to be confirmed)  01/28/2023   TETANUS/TDAP  04/22/2025   Pneumonia Vaccine 41+ Years old  Completed   DEXA SCAN  Completed   Zoster Vaccines- Shingrix  Completed   HPV VACCINES  Aged Out     ----------------------------------------------------------------------------------------------------------------------------------------------------------------------------------------------------------------- Physical Exam BP 118/71 (BP Location: Left Arm, Patient Position: Sitting, Cuff Size: Normal)   Pulse 69   Ht 5\' 8"  (1.727 m)   Wt 189 lb (85.7 kg)   SpO2 97%   BMI 28.74 kg/m   Physical Exam Constitutional:      General: She is  not in acute distress. HENT:     Head: Normocephalic and atraumatic.     Right Ear: Tympanic membrane and ear canal normal.     Left Ear: Tympanic membrane and ear canal normal.     Nose: Nose normal.  Eyes:     General: No scleral icterus.    Conjunctiva/sclera: Conjunctivae normal.  Neck:     Thyroid: No thyromegaly.  Cardiovascular:     Rate and Rhythm: Normal rate and regular rhythm.     Heart sounds: Normal heart sounds.  Pulmonary:     Effort: Pulmonary effort is  normal.     Breath sounds: Normal breath sounds.  Abdominal:     General: Bowel sounds are normal. There is no distension.     Palpations: Abdomen is soft.     Tenderness: There is no abdominal tenderness. There is no guarding.  Musculoskeletal:        General: Normal range of motion.     Cervical back: Normal range of motion and neck supple.  Lymphadenopathy:     Cervical: No cervical adenopathy.  Skin:    General: Skin is warm and dry.     Findings: No rash.  Neurological:     General: No focal deficit present.     Mental Status: She is alert and oriented to person, place, and time.     Cranial Nerves: No cranial nerve deficit.     Coordination: Coordination normal.  Psychiatric:        Mood and Affect: Mood normal.        Behavior: Behavior normal.     ------------------------------------------------------------------------------------------------------------------------------------------------------------------------------------------------------------------- Assessment and Plan  Well adult exam Well adult Orders Placed This Encounter  Procedures   TSH  Screenings: Up-to-date Immunizations: Up-to-date Anticipatory guidance/risk factor reduction: Recommendations per AVS   No orders of the defined types were placed in this encounter.   No follow-ups on file.    This visit occurred during the SARS-CoV-2 public health emergency.  Safety protocols were in place, including screening questions prior to the visit, additional usage of staff PPE, and extensive cleaning of exam room while observing appropriate contact time as indicated for disinfecting solutions.

## 2022-01-28 NOTE — Patient Instructions (Signed)
Preventive Care 65 Years and Older, Female Preventive care refers to lifestyle choices and visits with your health care provider that can promote health and wellness. Preventive care visits are also called wellness exams. What can I expect for my preventive care visit? Counseling Your health care provider may ask you questions about your: Medical history, including: Past medical problems. Family medical history. Pregnancy and menstrual history. History of falls. Current health, including: Memory and ability to understand (cognition). Emotional well-being. Home life and relationship well-being. Sexual activity and sexual health. Lifestyle, including: Alcohol, nicotine or tobacco, and drug use. Access to firearms. Diet, exercise, and sleep habits. Work and work environment. Sunscreen use. Safety issues such as seatbelt and bike helmet use. Physical exam Your health care provider will check your: Height and weight. These may be used to calculate your BMI (body mass index). BMI is a measurement that tells if you are at a healthy weight. Waist circumference. This measures the distance around your waistline. This measurement also tells if you are at a healthy weight and may help predict your risk of certain diseases, such as type 2 diabetes and high blood pressure. Heart rate and blood pressure. Body temperature. Skin for abnormal spots. What immunizations do I need?  Vaccines are usually given at various ages, according to a schedule. Your health care provider will recommend vaccines for you based on your age, medical history, and lifestyle or other factors, such as travel or where you work. What tests do I need? Screening Your health care provider may recommend screening tests for certain conditions. This may include: Lipid and cholesterol levels. Hepatitis C test. Hepatitis B test. HIV (human immunodeficiency virus) test. STI (sexually transmitted infection) testing, if you are at  risk. Lung cancer screening. Colorectal cancer screening. Diabetes screening. This is done by checking your blood sugar (glucose) after you have not eaten for a while (fasting). Mammogram. Talk with your health care provider about how often you should have regular mammograms. BRCA-related cancer screening. This may be done if you have a family history of breast, ovarian, tubal, or peritoneal cancers. Bone density scan. This is done to screen for osteoporosis. Talk with your health care provider about your test results, treatment options, and if necessary, the need for more tests. Follow these instructions at home: Eating and drinking  Eat a diet that includes fresh fruits and vegetables, whole grains, lean protein, and low-fat dairy products. Limit your intake of foods with high amounts of sugar, saturated fats, and salt. Take vitamin and mineral supplements as recommended by your health care provider. Do not drink alcohol if your health care provider tells you not to drink. If you drink alcohol: Limit how much you have to 0-1 drink a day. Know how much alcohol is in your drink. In the [place] one drink equals one 12 oz bottle of beer (355 mL), one 5 oz glass of wine (148 mL), or one 1 oz glass of hard liquor (44 mL). Lifestyle Brush your teeth every morning and night with fluoride toothpaste. Floss one time each day. Exercise for at least 30 minutes 5 or more days each week. Do not use any products that contain nicotine or tobacco. These products include cigarettes, chewing tobacco, and vaping devices, such as e-cigarettes. If you need help quitting, ask your health care provider. Do not use drugs. If you are sexually active, practice safe sex. Use a condom or other form of protection in order to prevent STIs. Take aspirin only as told by   your health care provider. Make sure that you understand how much to take and what form to take. Work with your health care provider to find out whether it  is safe and beneficial for you to take aspirin daily. Ask your health care provider if you need to take a cholesterol-lowering medicine (statin). Find healthy ways to manage stress, such as: Meditation, yoga, or listening to music. Journaling. Talking to a trusted person. Spending time with friends and family. Minimize exposure to UV radiation to reduce your risk of skin cancer. Safety Always wear your seat belt while driving or riding in a vehicle. Do not drive: If you have been drinking alcohol. Do not ride with someone who has been drinking. When you are tired or distracted. While texting. If you have been using any mind-altering substances or drugs. Wear a helmet and other protective equipment during sports activities. If you have firearms in your house, make sure you follow all gun safety procedures. What's next? Visit your health care provider once a year for an annual wellness visit. Ask your health care provider how often you should have your eyes and teeth checked. Stay up to date on all vaccines. This information is not intended to replace advice given to you by your health care provider. Make sure you discuss any questions you have with your health care provider. Document Revised: 11/27/2020 Document Reviewed: 11/27/2020 Elsevier Patient Education  [place].

## 2022-01-29 LAB — TSH: TSH: 0.83 mIU/L (ref 0.40–4.50)

## 2022-02-04 ENCOUNTER — Encounter: Payer: Self-pay | Admitting: General Practice

## 2022-02-11 DIAGNOSIS — J3089 Other allergic rhinitis: Secondary | ICD-10-CM | POA: Diagnosis not present

## 2022-02-18 DIAGNOSIS — J3089 Other allergic rhinitis: Secondary | ICD-10-CM | POA: Diagnosis not present

## 2022-02-23 ENCOUNTER — Other Ambulatory Visit: Payer: Self-pay | Admitting: Family Medicine

## 2022-02-23 DIAGNOSIS — T63441A Toxic effect of venom of bees, accidental (unintentional), initial encounter: Secondary | ICD-10-CM

## 2022-02-25 DIAGNOSIS — J3089 Other allergic rhinitis: Secondary | ICD-10-CM | POA: Diagnosis not present

## 2022-03-03 ENCOUNTER — Other Ambulatory Visit: Payer: Self-pay | Admitting: Family Medicine

## 2022-03-03 MED ORDER — LEVOTHYROXINE SODIUM 100 MCG PO TABS: 100.0000 ug | ORAL_TABLET | Freq: Every day | ORAL | 0 refills | Status: DC

## 2022-03-04 DIAGNOSIS — J3089 Other allergic rhinitis: Secondary | ICD-10-CM | POA: Diagnosis not present

## 2022-03-12 ENCOUNTER — Ambulatory Visit: Payer: Medicare PPO | Admitting: Family Medicine

## 2022-03-12 ENCOUNTER — Encounter: Payer: Self-pay | Admitting: Family Medicine

## 2022-03-12 ENCOUNTER — Ambulatory Visit (INDEPENDENT_AMBULATORY_CARE_PROVIDER_SITE_OTHER): Payer: Medicare PPO

## 2022-03-12 VITALS — BP 119/77 | HR 67 | Ht 68.0 in | Wt 187.0 lb

## 2022-03-12 DIAGNOSIS — R051 Acute cough: Secondary | ICD-10-CM | POA: Diagnosis not present

## 2022-03-12 DIAGNOSIS — R059 Cough, unspecified: Secondary | ICD-10-CM | POA: Diagnosis not present

## 2022-03-12 DIAGNOSIS — R030 Elevated blood-pressure reading, without diagnosis of hypertension: Secondary | ICD-10-CM | POA: Insufficient documentation

## 2022-03-12 MED ORDER — PREDNISONE 20 MG PO TABS: 20.0000 mg | ORAL_TABLET | Freq: Every day | ORAL | 0 refills | Status: AC

## 2022-03-12 MED ORDER — AZITHROMYCIN 250 MG PO TABS: ORAL_TABLET | ORAL | 0 refills | Status: AC

## 2022-03-12 MED ORDER — DM-GUAIFENESIN ER 30-600 MG PO TB12: 1.0000 | ORAL_TABLET | Freq: Two times a day (BID) | ORAL | 0 refills | Status: AC | PRN

## 2022-03-12 NOTE — Progress Notes (Signed)
Acute Office Visit  Subjective:     Patient ID: Danielle Galvan, female    DOB: [birth year], 66 y.o.   MRN: [id number]  Chief Complaint  Patient presents with   Cough    HPI Patient is in today for cough and congestion for one week. Notes cough as productive. She can't cough up the sputum fully.   She has a chronic cough that has happened for 30years she was able to get rid of it with allergen symptoms. 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 She was out in a wet field 8 days ago. She has tried to use antihistamines and control at home with supportive care.   Review of Systems  Constitutional:  Negative for chills and fever.  Respiratory:  Positive for cough. Negative for shortness of breath.   Cardiovascular:  Negative for chest pain.  Neurological:  Negative for headaches.        Objective:    BP 119/77   Pulse 67   Ht 5\' 8"  (1.727 m)   Wt 187 lb (84.8 kg)   SpO2 99%   BMI 28.43 kg/m    Physical Exam Vitals and nursing note reviewed.  Constitutional:      General: She is not in acute distress.    Appearance: Normal appearance.  HENT:     Head: Normocephalic and atraumatic.     Right Ear: External ear normal.     Left Ear: External ear normal.     Nose: Nose normal.  Eyes:     Conjunctiva/sclera: Conjunctivae normal.  Cardiovascular:     Rate and Rhythm: Normal rate and regular rhythm.  Pulmonary:     Effort: Pulmonary effort is normal.     Breath sounds: Normal breath sounds.  Neurological:     General: No focal deficit present.     Mental Status: She is alert and oriented to person, place, and time.  Psychiatric:        Mood and Affect: Mood normal.        Behavior: Behavior normal.        Thought Content: Thought content normal.        Judgment: Judgment normal.     No results found for any visits on 03/12/22.      Assessment & Plan:   Problem List Items Addressed This Visit       Other   Acute cough - Primary    -  covid test ordered and interpreted by myself as negative - flu test ordered and interpreted by myself as negative - cough sounds deep on physical exam and patient is having difficulty expressing the sputum. Will go ahead and give mucinex dm to help with cough and sputum productive.  - will order CXR to rule out pna  - given short course of steroid to help with inflammation - pt given zpack to pick up IF she needs it over the weekend if symptoms worsen      Relevant Medications   dextromethorphan-guaiFENesin (MUCINEX DM) 30-600 MG 12hr tablet   predniSONE (DELTASONE) 20 MG tablet   Other Relevant Orders   DG Chest 2 View   Elevated blood pressure reading    - repeat BP wnl 119/77  - no further workup needed.        Meds ordered this encounter  Medications   dextromethorphan-guaiFENesin (MUCINEX DM) 30-600 MG 12hr tablet    Sig: Take 1 tablet by mouth 2 (two) times daily as needed  for up to 5 days for cough.    Dispense:  10 tablet    Refill:  0   predniSONE (DELTASONE) 20 MG tablet    Sig: Take 1 tablet (20 mg total) by mouth daily with breakfast for 5 days.    Dispense:  5 tablet    Refill:  0   azithromycin (ZITHROMAX) 250 MG tablet    Sig: Take 2 tablets on day 1, then 1 tablet daily on days 2 through 5    Dispense:  6 tablet    Refill:  0    Return if symptoms worsen or fail to improve.  Charlton Amor, DO

## 2022-03-12 NOTE — Assessment & Plan Note (Addendum)
-   covid test ordered and interpreted by myself as negative - flu test ordered and interpreted by myself as negative - cough sounds deep on physical exam and patient is having difficulty expressing the sputum. Will go ahead and give mucinex dm to help with cough and sputum productive.  - will order CXR to rule out pna  - given short course of steroid to help with inflammation - pt given zpack to pick up IF she needs it over the weekend if symptoms worsen

## 2022-03-12 NOTE — Assessment & Plan Note (Signed)
-   repeat BP wnl 119/77  - no further workup needed.

## 2022-04-06 ENCOUNTER — Ambulatory Visit (INDEPENDENT_AMBULATORY_CARE_PROVIDER_SITE_OTHER): Payer: Medicare PPO | Admitting: Family Medicine

## 2022-04-06 ENCOUNTER — Encounter: Payer: Self-pay | Admitting: Family Medicine

## 2022-04-06 DIAGNOSIS — Z0000 Encounter for general adult medical examination without abnormal findings: Secondary | ICD-10-CM | POA: Diagnosis not present

## 2022-04-06 DIAGNOSIS — Z780 Asymptomatic menopausal state: Secondary | ICD-10-CM

## 2022-04-06 NOTE — Progress Notes (Signed)
MEDICARE ANNUAL WELLNESS VISIT  04/06/2022  Telephone Visit Disclaimer This Medicare AWV was conducted by telephone due to national recommendations for restrictions regarding the COVID-19 Pandemic (e.g. social distancing).  I verified, using two identifiers, that I am speaking with Jill Alexanders or their authorized healthcare agent. I discussed the limitations, risks, security, and privacy concerns of performing an evaluation and management service by telephone and the potential availability of an in-person appointment in the future. The patient expressed understanding and agreed to proceed.  Location of Patient: Home Location of Provider (nurse):  In the office.  Subjective:    Vaness Jelinski is a 66 y.o. female patient of Luetta Nutting, DO who had a Medicare Annual Wellness Visit today via telephone. Arrionna is Retired and lives with their spouse. she has 4 children. she reports that she is socially active and does interact with friends/family regularly. she is moderately physically active and enjoys RV camping, bicycling, gardening and hiking.   Patient Care Team: Luetta Nutting, DO as PCP - General (Family Medicine)     04/06/2022    1:07 PM 11/16/2018    3:40 PM  Advanced Directives  Does Patient Have a Medical Advance Directive? Yes Yes  Type of Advance Directive Living will [place];Living will  Does patient want to make changes to medical advance directive? No - Patient declined No - Patient declined  Copy of [place] in Chart?  No - copy requested    Hospital Utilization Over the Past 12 Months: # of hospitalizations or ER visits: 0 # of surgeries: 1- [facility] outpatient surgery.  Review of Systems    Patient reports that her overall health is better compared to last year.  History obtained from chart review and the patient  Patient Reported Readings (BP, Pulse, CBG, Weight, etc) none  Pain Assessment Pain : No/denies pain      Current Medications & Allergies (verified) Allergies as of 04/06/2022       Reactions   Sulfa Antibiotics Hives, Rash   Ciprofloxacin    Ivy Leaf [hedera Helix]    Other Rash        Medication List        Accurate as of April 06, 2022  1:25 PM. If you have any questions, ask your nurse or doctor.          Clobetasol Prop Emollient Base 0.05 % emollient cream   levothyroxine 100 MCG tablet Commonly known as: SYNTHROID Take 1 tablet (100 mcg total) by mouth daily before breakfast.   Tretinoin 0.05 % Lotn        History (reviewed): Past Medical History:  Diagnosis Date   Allergy    see file   Hypothyroid 06/19/2014   Shingles 11/2013   Past Surgical History:  Procedure Laterality Date   APPENDECTOMY  11/13/2008   Family History  Problem Relation Age of Onset   Prostate cancer Father    Dementia Father    Alcohol abuse Brother    Cancer Brother    Obesity Brother    Kidney disease Brother    Obesity Brother    Social History   Socioeconomic History   Marital status: Married    Spouse name: Maliaka Brasington   Number of children: 4   Years of education: 24   Highest education level: Master's degree (e.g., MA, MS, MEng, MEd, MSW, MBA)  Occupational History   Occupation: Retired  Tobacco Use   Smoking status: Never   Smokeless  tobacco: Never  Substance and Sexual Activity   Alcohol use: No   Drug use: No   Sexual activity: Not Currently    Partners: Male    Birth control/protection: None  Other Topics Concern   Not on file  Social History Narrative   Lives with her husband. She enjoys RV camping, bicycling, gardening and hiking.    Social Determinants of Health   Financial Resource Strain: Low Risk  (04/05/2022)   Overall Financial Resource Strain (CARDIA)    Difficulty of Paying Living Expenses: Not hard at all  Food Insecurity: No Food Insecurity (04/05/2022)   Hunger Vital Sign    Worried About Running Out of Food in the Last Year:  Never true    Ran Out of Food in the Last Year: Never true  Transportation Needs: No Transportation Needs (04/05/2022)   PRAPARE - Hydrologist (Medical): No    Lack of Transportation (Non-Medical): No  Physical Activity: Sufficiently Active (04/05/2022)   Exercise Vital Sign    Days of Exercise per Week: 6 days    Minutes of Exercise per Session: 100 min  Stress: No Stress Concern Present (04/05/2022)   [place]    Feeling of Stress : Not at all  Social Connections: [place] (04/06/2022)   Social Connection and Isolation Panel [NHANES]    Frequency of Communication with Friends and Family: Never    Frequency of Social Gatherings with Friends and Family: More than three times a week    Attends Religious Services: More than 4 times per year    Active Member of [employer] or Organizations: Yes    Attends Archivist Meetings: More than 4 times per year    Marital Status: Married    Activities of Daily Living    04/05/2022    5:24 AM  In your present state of health, do you have any difficulty performing the following activities:  Hearing? 0  Vision? 0  Difficulty concentrating or making decisions? 0  Walking or climbing stairs? 0  Dressing or bathing? 0  Doing errands, shopping? 0  Preparing Food and eating ? N  Using the Toilet? N  In the past six months, have you accidently leaked urine? N  Do you have problems with loss of bowel control? N  Managing your Medications? N  Managing your Finances? N  Housekeeping or managing your Housekeeping? N    Patient Education/ Literacy How often do you need to have someone help you when you read instructions, pamphlets, or other written materials from your doctor or pharmacy?: 1 - Never What is the last grade level you completed in school?: Master's degree and plus  Exercise Current Exercise Habits: Home exercise routine,  Type of exercise: walking, Time (Minutes): > 60, Frequency (Times/Week): 6, Weekly Exercise (Minutes/Week): 0, Intensity: Moderate, Exercise limited by: None identified  Diet Patient reports consuming  2-3  meals a day and 0-1 snack(s) a day Patient reports that her primary diet is: Regular Patient reports that she does have regular access to food.   Depression Screen    04/06/2022    1:07 PM 05/07/2021    2:11 PM 01/07/2021    8:28 AM 12/19/2019    1:32 PM 11/16/2018    3:40 PM 02/24/2017    3:40 PM 02/24/2017    3:33 PM  PHQ 2/9 Scores  PHQ - 2 Score 0 0 0 1 0 0 0  PHQ-  9 Score    1  2   Exception Documentation       Patient refusal     Fall Risk    04/06/2022    1:07 PM 04/05/2022    5:24 AM 05/07/2021    2:11 PM 01/07/2021    8:28 AM 12/19/2019    1:32 PM  Fall Risk   Falls in the past year? 1 0 0 1 0  Number falls in past yr: 0 0 0 0 1  Injury with Fall? 0 0 0 0 0  Risk for fall due to : Other (Comment)  No Fall Risks Impaired balance/gait   Risk for fall due to: Comment while riding her bike.      Follow up Falls evaluation completed;Education provided;Falls prevention discussed  Falls evaluation completed Falls evaluation completed;Falls prevention discussed      Objective:  Haydan Wedig seemed alert and oriented and she participated appropriately during our telephone visit.  Blood Pressure Weight BMI  BP Readings from Last 3 Encounters:  03/12/22 119/77  01/28/22 118/71  12/04/21 112/71   Wt Readings from Last 3 Encounters:  03/12/22 187 lb (84.8 kg)  01/28/22 189 lb (85.7 kg)  12/04/21 186 lb (84.4 kg)   BMI Readings from Last 1 Encounters:  03/12/22 28.43 kg/m    *Unable to obtain current vital signs, weight, and BMI due to telephone visit type  Hearing/Vision  Jacqueli did not seem to have difficulty with hearing/understanding during the telephone conversation Reports that she has had a formal eye exam by an eye care professional within the past  year Reports that she has not had a formal hearing evaluation within the past year *Unable to fully assess hearing and vision during telephone visit type  Cognitive Function:    04/06/2022    1:14 PM  6CIT Screen  What Year? 0 points  What month? 0 points  What time? 0 points  Count back from 20 0 points  Months in reverse 0 points  Repeat phrase 0 points  Total Score 0 points   (Normal:0-7, Significant for Dysfunction: >8)  Normal Cognitive Function Screening: Yes   Immunization & Health Maintenance Record Immunization History  Administered Date(s) Administered   Fluad Quad(high Dose 65+) 02/24/2019   Hepatitis B 04/22/2012, 08/17/2013   Influenza, Quadrivalent, Recombinant, Inj, Pf 03/22/2018   Influenza,inj,Quad PF,6+ Mos 03/19/2020   Influenza-Unspecified 04/23/2015, 07/15/2016   MMR 02/02/1994   PFIZER Comirnaty(Gray Top)Covid-19 Tri-Sucrose Vaccine 10/01/2020   PFIZER(Purple Top)SARS-COV-2 Vaccination 07/12/2019, 08/02/2019, 04/05/2020   PNEUMOCOCCAL CONJUGATE-20 12/04/2021   PPD Test 01/23/2015   Pfizer Covid-19 Vaccine Bivalent Booster 84yr & up 12/04/2021   Pneumococcal Polysaccharide-23 11/16/2018   Td 01/17/2004   Tdap 04/23/2015   Zoster Recombinat (Shingrix) 03/19/2020, 10/01/2020    Health Maintenance  Topic Date Due   COVID-19 Vaccine (6 - [facility]) 04/22/2022 (Originally 04/05/2022)   INFLUENZA VACCINE  09/13/2022 (Originally 01/13/2022)   Hepatitis C Screening  12/05/2022 (Originally 09/14/1973)   MAMMOGRAM  10/17/2022   COLONOSCOPY (Pts 45-421yrInsurance coverage will need to be confirmed)  01/28/2023   TETANUS/TDAP  04/22/2025   Pneumonia Vaccine 6570Years old  Completed   DEXA SCAN  Completed   Zoster Vaccines- Shingrix  Completed   HPV VACCINES  Aged Out       Assessment  This is a routine wellness examination for [employer] Health Maintenance: Due or Overdue There are no preventive care reminders to display for this  patient.   [facility]  Burmaster does not need a referral for [employer] Assistance: Care Management:   no Social Work:    no Prescription Assistance:  no Nutrition/Diabetes Education:  no   Plan:  Personalized Goals  Goals Addressed               This Visit's Progress     Patient Stated (pt-stated)        Loose 5 lbs.       Personalized Health Maintenance & Screening Recommendations  Influenza vaccine Bone densitometry screening  Lung Cancer Screening Recommended: no (Low Dose CT Chest recommended if Age 59-80 years, 30 pack-year currently smoking OR have quit w/in past 15 years) Hepatitis C Screening recommended: yes HIV Screening recommended: no  Advanced Directives: Written information was not prepared per patient's request.  Referrals & Orders Orders Placed This Encounter  Procedures   [place]    Follow-up Plan Follow-up with Luetta Nutting, DO as planned Medicare wellness visit in one year.  Patient will access AVS on my chart.   I have personally reviewed and noted the following in the patient's chart:   Medical and social history Use of alcohol, tobacco or illicit drugs  Current medications and supplements Functional ability and status Nutritional status Physical activity Advanced directives List of other physicians Hospitalizations, surgeries, and ER visits in previous 12 months Vitals Screenings to include cognitive, depression, and falls Referrals and appointments  In addition, I have reviewed and discussed with Jill Alexanders certain preventive protocols, quality metrics, and best practice recommendations. A written personalized care plan for preventive services as well as general preventive health recommendations is available and can be mailed to the patient at her request.      Tinnie Gens, RN BSN  04/06/2022

## 2022-04-06 NOTE — Patient Instructions (Signed)
[place] Maintenance Summary and Written Plan of Care  Ms. Danielle Galvan ,  Thank you for allowing me to perform your Medicare Annual Wellness Visit and for your ongoing commitment to your health.   Health Maintenance & Immunization History Health Maintenance  Topic Date Due   COVID-19 Vaccine (6 - Pfizer series) 04/22/2022 (Originally 04/05/2022)   INFLUENZA VACCINE  09/13/2022 (Originally 01/13/2022)   Hepatitis C Screening  12/05/2022 (Originally 09/14/1973)   MAMMOGRAM  10/17/2022   COLONOSCOPY (Pts 45-81yr Insurance coverage will need to be confirmed)  01/28/2023   TETANUS/TDAP  04/22/2025   Pneumonia Vaccine 66 Years old  Completed   DEXA SCAN  Completed   Zoster Vaccines- Shingrix  Completed   HPV VACCINES  Aged Out   Immunization History  Administered Date(s) Administered   Fluad Quad(high Dose 68+) 02/24/2019   Hepatitis B 04/22/2012, 08/17/2013   Influenza, Quadrivalent, Recombinant, Inj, Pf 03/22/2018   Influenza,inj,Quad PF,6+ Mos 03/19/2020   Influenza-Unspecified 04/23/2015, 07/15/2016   MMR 02/02/1994   PFIZER Comirnaty(Gray Top)Covid-19 Tri-Sucrose Vaccine 10/01/2020   PFIZER(Purple Top)SARS-COV-2 Vaccination 07/12/2019, 08/02/2019, 04/05/2020   PNEUMOCOCCAL CONJUGATE-20 12/04/2021   PPD Test 01/23/2015   Pfizer Covid-19 Vaccine Bivalent Booster 68yr& up 12/04/2021   Pneumococcal Polysaccharide-23 11/16/2018   Td 01/17/2004   Tdap 04/23/2015   Zoster Recombinat (Shingrix) 03/19/2020, 10/01/2020    These are the patient goals that we discussed:  Goals Addressed               This Visit's Progress     Patient Stated (pt-stated)        Loose 5 lbs.         This is a list of Health Maintenance Items that are overdue or due now: Influenza vaccine Bone densitometry screening  Orders/Referrals Placed Today: Orders Placed This Encounter  Procedures   DEXAScan    Standing Status:   Future    Standing Expiration Date:    04/07/2023    Scheduling Instructions:     Please call patient to schedule.    Order Specific Question:   Reason for exam:    Answer:   Post menopausal    Order Specific Question:   Preferred imaging location?    Answer:   [facility]  (Contact our referral department at [phone] you have not spoken with someone about your referral appointment within the next 5 days)    Follow-up Plan Follow-up with MaLuetta NuttingDO as planned Medicare wellness visit in one year.  Patient will access AVS on my chart.      Health Maintenance, Female Adopting a healthy lifestyle and getting preventive care are important in promoting health and wellness. Ask your health care provider about: The right schedule for you to have regular tests and exams. Things you can do on your own to prevent diseases and keep yourself healthy. What should I know about diet, weight, and exercise? Eat a healthy diet  Eat a diet that includes plenty of vegetables, fruits, low-fat dairy products, and lean protein. Do not eat a lot of foods that are high in solid fats, added sugars, or sodium. Maintain a healthy weight Body mass index (BMI) is used to identify weight problems. It estimates body fat based on height and weight. Your health care provider can help determine your BMI and help you achieve or maintain a healthy weight. Get regular exercise Get regular exercise. This is one of the most important things you can do for  your health. Most adults should: Exercise for at least 150 minutes each week. The exercise should increase your heart rate and make you sweat (moderate-intensity exercise). Do strengthening exercises at least twice a week. This is in addition to the moderate-intensity exercise. Spend less time sitting. Even light physical activity can be beneficial. Watch cholesterol and blood lipids Have your blood tested for lipids and cholesterol at 68 years of age, then have this test every 5  years. Have your cholesterol levels checked more often if: Your lipid or cholesterol levels are high. You are older than 67 years of age. You are at high risk for heart disease. What should I know about cancer screening? Depending on your health history and family history, you may need to have cancer screening at various ages. This may include screening for: Breast cancer. Cervical cancer. Colorectal cancer. Skin cancer. Lung cancer. What should I know about heart disease, diabetes, and high blood pressure? Blood pressure and heart disease High blood pressure causes heart disease and increases the risk of stroke. This is more likely to develop in people who have high blood pressure readings or are overweight. Have your blood pressure checked: Every 3-5 years if you are 68-68 years of age. Every year if you are 20 years old or older. Diabetes Have regular diabetes screenings. This checks your fasting blood sugar level. Have the screening done: Once every three years after age 68 if you are at a normal weight and have a low risk for diabetes. More often and at a younger age if you are overweight or have a high risk for diabetes. What should I know about preventing infection? Hepatitis B If you have a higher risk for hepatitis B, you should be screened for this virus. Talk with your health care provider to find out if you are at risk for hepatitis B infection. Hepatitis C Testing is recommended for: Everyone born from [birth year] through 1967. Anyone with known risk factors for hepatitis C. Sexually transmitted infections (STIs) Get screened for STIs, including gonorrhea and chlamydia, if: You are sexually active and are younger than 68 years of age. You are older than 68 years of age and your health care provider tells you that you are at risk for this type of infection. Your sexual activity has changed since you were last screened, and you are at increased risk for chlamydia or gonorrhea.  Ask your health care provider if you are at risk. Ask your health care provider about whether you are at high risk for HIV. Your health care provider may recommend a prescription medicine to help prevent HIV infection. If you choose to take medicine to prevent HIV, you should first get tested for HIV. You should then be tested every 3 months for as long as you are taking the medicine. Pregnancy If you are about to stop having your period (premenopausal) and you may become pregnant, seek counseling before you get pregnant. Take 400 to 800 micrograms (mcg) of folic acid every day if you become pregnant. Ask for birth control (contraception) if you want to prevent pregnancy. Osteoporosis and menopause Osteoporosis is a disease in which the bones lose minerals and strength with aging. This can result in bone fractures. If you are 83 years old or older, or if you are at risk for osteoporosis and fractures, ask your health care provider if you should: Be screened for bone loss. Take a calcium or vitamin D supplement to lower your risk of fractures. Be given hormone replacement  therapy (HRT) to treat symptoms of menopause. Follow these instructions at home: Alcohol use Do not drink alcohol if: Your health care provider tells you not to drink. You are pregnant, may be pregnant, or are planning to become pregnant. If you drink alcohol: Limit how much you have to: 0-1 drink a day. Know how much alcohol is in your drink. In the [place] one drink equals one 12 oz bottle of beer (355 mL), one 5 oz glass of wine (148 mL), or one 1 oz glass of hard liquor (44 mL). Lifestyle Do not use any products that contain nicotine or tobacco. These products include cigarettes, chewing tobacco, and vaping devices, such as e-cigarettes. If you need help quitting, ask your health care provider. Do not use street drugs. Do not share needles. Ask your health care provider for help if you need support or information about  quitting drugs. General instructions Schedule regular health, dental, and eye exams. Stay current with your vaccines. Tell your health care provider if: You often feel depressed. You have ever been abused or do not feel safe at home. Summary Adopting a healthy lifestyle and getting preventive care are important in promoting health and wellness. Follow your health care provider's instructions about healthy diet, exercising, and getting tested or screened for diseases. Follow your health care provider's instructions on monitoring your cholesterol and blood pressure. This information is not intended to replace advice given to you by your health care provider. Make sure you discuss any questions you have with your health care provider. Document Revised: 10/21/2020 Document Reviewed: 10/21/2020 Elsevier Patient Education  [place].

## 2022-04-08 DIAGNOSIS — J3089 Other allergic rhinitis: Secondary | ICD-10-CM | POA: Diagnosis not present

## 2022-04-15 DIAGNOSIS — J3089 Other allergic rhinitis: Secondary | ICD-10-CM | POA: Diagnosis not present

## 2022-04-27 DIAGNOSIS — J3089 Other allergic rhinitis: Secondary | ICD-10-CM | POA: Diagnosis not present

## 2022-04-29 DIAGNOSIS — J3089 Other allergic rhinitis: Secondary | ICD-10-CM | POA: Diagnosis not present

## 2022-05-13 DIAGNOSIS — J3089 Other allergic rhinitis: Secondary | ICD-10-CM | POA: Diagnosis not present

## 2022-05-27 ENCOUNTER — Other Ambulatory Visit: Payer: Self-pay | Admitting: Family Medicine

## 2022-05-27 DIAGNOSIS — J3089 Other allergic rhinitis: Secondary | ICD-10-CM | POA: Diagnosis not present

## 2022-06-10 DIAGNOSIS — J3089 Other allergic rhinitis: Secondary | ICD-10-CM | POA: Diagnosis not present

## 2022-06-24 DIAGNOSIS — J3089 Other allergic rhinitis: Secondary | ICD-10-CM | POA: Diagnosis not present

## 2022-07-08 DIAGNOSIS — J3089 Other allergic rhinitis: Secondary | ICD-10-CM | POA: Diagnosis not present

## 2022-07-15 ENCOUNTER — Ambulatory Visit (INDEPENDENT_AMBULATORY_CARE_PROVIDER_SITE_OTHER): Payer: Medicare PPO

## 2022-07-15 DIAGNOSIS — Z0000 Encounter for general adult medical examination without abnormal findings: Secondary | ICD-10-CM

## 2022-07-15 DIAGNOSIS — Z780 Asymptomatic menopausal state: Secondary | ICD-10-CM | POA: Diagnosis not present

## 2022-07-22 DIAGNOSIS — J3089 Other allergic rhinitis: Secondary | ICD-10-CM | POA: Diagnosis not present

## 2022-08-05 DIAGNOSIS — J3089 Other allergic rhinitis: Secondary | ICD-10-CM | POA: Diagnosis not present

## 2022-08-05 DIAGNOSIS — L820 Inflamed seborrheic keratosis: Secondary | ICD-10-CM | POA: Diagnosis not present

## 2022-08-19 DIAGNOSIS — J3089 Other allergic rhinitis: Secondary | ICD-10-CM | POA: Diagnosis not present

## 2022-08-26 ENCOUNTER — Other Ambulatory Visit: Payer: Self-pay | Admitting: Family Medicine

## 2022-09-02 DIAGNOSIS — J3089 Other allergic rhinitis: Secondary | ICD-10-CM | POA: Diagnosis not present

## 2022-09-07 ENCOUNTER — Other Ambulatory Visit: Payer: Self-pay | Admitting: Family Medicine

## 2022-09-07 DIAGNOSIS — Z1231 Encounter for screening mammogram for malignant neoplasm of breast: Secondary | ICD-10-CM

## 2022-09-16 DIAGNOSIS — J3089 Other allergic rhinitis: Secondary | ICD-10-CM | POA: Diagnosis not present

## 2022-09-30 DIAGNOSIS — J3089 Other allergic rhinitis: Secondary | ICD-10-CM | POA: Diagnosis not present

## 2022-10-02 DIAGNOSIS — J3089 Other allergic rhinitis: Secondary | ICD-10-CM | POA: Diagnosis not present

## 2022-10-14 DIAGNOSIS — J3089 Other allergic rhinitis: Secondary | ICD-10-CM | POA: Diagnosis not present

## 2022-10-21 ENCOUNTER — Ambulatory Visit (INDEPENDENT_AMBULATORY_CARE_PROVIDER_SITE_OTHER): Payer: Medicare PPO

## 2022-10-21 DIAGNOSIS — Z1231 Encounter for screening mammogram for malignant neoplasm of breast: Secondary | ICD-10-CM

## 2022-10-21 DIAGNOSIS — J3089 Other allergic rhinitis: Secondary | ICD-10-CM | POA: Diagnosis not present

## 2022-10-21 DIAGNOSIS — T63451A Toxic effect of venom of hornets, accidental (unintentional), initial encounter: Secondary | ICD-10-CM | POA: Diagnosis not present

## 2022-10-28 DIAGNOSIS — J3089 Other allergic rhinitis: Secondary | ICD-10-CM | POA: Diagnosis not present

## 2022-11-11 DIAGNOSIS — J3089 Other allergic rhinitis: Secondary | ICD-10-CM | POA: Diagnosis not present

## 2022-11-24 ENCOUNTER — Other Ambulatory Visit: Payer: Self-pay | Admitting: Family Medicine

## 2022-11-25 DIAGNOSIS — J3089 Other allergic rhinitis: Secondary | ICD-10-CM | POA: Diagnosis not present

## 2022-12-09 DIAGNOSIS — J3089 Other allergic rhinitis: Secondary | ICD-10-CM | POA: Diagnosis not present

## 2023-01-06 DIAGNOSIS — J3089 Other allergic rhinitis: Secondary | ICD-10-CM | POA: Diagnosis not present

## 2023-01-19 DIAGNOSIS — L578 Other skin changes due to chronic exposure to nonionizing radiation: Secondary | ICD-10-CM | POA: Diagnosis not present

## 2023-01-19 DIAGNOSIS — L574 Cutis laxa senilis: Secondary | ICD-10-CM | POA: Diagnosis not present

## 2023-01-19 DIAGNOSIS — Z85828 Personal history of other malignant neoplasm of skin: Secondary | ICD-10-CM | POA: Diagnosis not present

## 2023-01-19 DIAGNOSIS — Z129 Encounter for screening for malignant neoplasm, site unspecified: Secondary | ICD-10-CM | POA: Diagnosis not present

## 2023-01-20 DIAGNOSIS — J3089 Other allergic rhinitis: Secondary | ICD-10-CM | POA: Diagnosis not present

## 2023-02-03 ENCOUNTER — Ambulatory Visit (INDEPENDENT_AMBULATORY_CARE_PROVIDER_SITE_OTHER): Payer: Medicare PPO | Admitting: Family Medicine

## 2023-02-03 ENCOUNTER — Encounter: Payer: Self-pay | Admitting: Family Medicine

## 2023-02-03 VITALS — BP 107/63 | HR 81 | Ht 68.0 in | Wt 188.0 lb

## 2023-02-03 DIAGNOSIS — Z0000 Encounter for general adult medical examination without abnormal findings: Secondary | ICD-10-CM | POA: Diagnosis not present

## 2023-02-03 DIAGNOSIS — E039 Hypothyroidism, unspecified: Secondary | ICD-10-CM | POA: Diagnosis not present

## 2023-02-03 DIAGNOSIS — Z1211 Encounter for screening for malignant neoplasm of colon: Secondary | ICD-10-CM

## 2023-02-03 DIAGNOSIS — Z23 Encounter for immunization: Secondary | ICD-10-CM | POA: Diagnosis not present

## 2023-02-03 DIAGNOSIS — Z13220 Encounter for screening for lipoid disorders: Secondary | ICD-10-CM

## 2023-02-03 DIAGNOSIS — J3089 Other allergic rhinitis: Secondary | ICD-10-CM | POA: Diagnosis not present

## 2023-02-03 NOTE — Patient Instructions (Signed)
Preventive Care 65 Years and Older, Female Preventive care refers to lifestyle choices and visits with your health care provider that can promote health and wellness. Preventive care visits are also called wellness exams. What can I expect for my preventive care visit? Counseling Your health care provider may ask you questions about your: Medical history, including: Past medical problems. Family medical history. Pregnancy and menstrual history. History of falls. Current health, including: Memory and ability to understand (cognition). Emotional well-being. Home life and relationship well-being. Sexual activity and sexual health. Lifestyle, including: Alcohol, nicotine or tobacco, and drug use. Access to firearms. Diet, exercise, and sleep habits. Work and work environment. Sunscreen use. Safety issues such as seatbelt and bike helmet use. Physical exam Your health care provider will check your: Height and weight. These may be used to calculate your BMI (body mass index). BMI is a measurement that tells if you are at a healthy weight. Waist circumference. This measures the distance around your waistline. This measurement also tells if you are at a healthy weight and may help predict your risk of certain diseases, such as type 2 diabetes and high blood pressure. Heart rate and blood pressure. Body temperature. Skin for abnormal spots. What immunizations do I need?  Vaccines are usually given at various ages, according to a schedule. Your health care provider will recommend vaccines for you based on your age, medical history, and lifestyle or other factors, such as travel or where you work. What tests do I need? Screening Your health care provider may recommend screening tests for certain conditions. This may include: Lipid and cholesterol levels. Hepatitis C test. Hepatitis B test. HIV (human immunodeficiency virus) test. STI (sexually transmitted infection) testing, if you are at  risk. Lung cancer screening. Colorectal cancer screening. Diabetes screening. This is done by checking your blood sugar (glucose) after you have not eaten for a while (fasting). Mammogram. Talk with your health care provider about how often you should have regular mammograms. BRCA-related cancer screening. This may be done if you have a family history of breast, ovarian, tubal, or peritoneal cancers. Bone density scan. This is done to screen for osteoporosis. Talk with your health care provider about your test results, treatment options, and if necessary, the need for more tests. Follow these instructions at home: Eating and drinking  Eat a diet that includes fresh fruits and vegetables, whole grains, lean protein, and low-fat dairy products. Limit your intake of foods with high amounts of sugar, saturated fats, and salt. Take vitamin and mineral supplements as recommended by your health care provider. Do not drink alcohol if your health care provider tells you not to drink. If you drink alcohol: Limit how much you have to 0-1 drink a day. Know how much alcohol is in your drink. In the [place] one drink equals one 12 oz bottle of beer (355 mL), one 5 oz glass of wine (148 mL), or one 1 oz glass of hard liquor (44 mL). Lifestyle Brush your teeth every morning and night with fluoride toothpaste. Floss one time each day. Exercise for at least 30 minutes 5 or more days each week. Do not use any products that contain nicotine or tobacco. These products include cigarettes, chewing tobacco, and vaping devices, such as e-cigarettes. If you need help quitting, ask your health care provider. Do not use drugs. If you are sexually active, practice safe sex. Use a condom or other form of protection in order to prevent STIs. Take aspirin only as told by   your health care provider. Make sure that you understand how much to take and what form to take. Work with your health care provider to find out whether it  is safe and beneficial for you to take aspirin daily. Ask your health care provider if you need to take a cholesterol-lowering medicine (statin). Find healthy ways to manage stress, such as: Meditation, yoga, or listening to music. Journaling. Talking to a trusted person. Spending time with friends and family. Minimize exposure to UV radiation to reduce your risk of skin cancer. Safety Always wear your seat belt while driving or riding in a vehicle. Do not drive: If you have been drinking alcohol. Do not ride with someone who has been drinking. When you are tired or distracted. While texting. If you have been using any mind-altering substances or drugs. Wear a helmet and other protective equipment during sports activities. If you have firearms in your house, make sure you follow all gun safety procedures. What's next? Visit your health care provider once a year for an annual wellness visit. Ask your health care provider how often you should have your eyes and teeth checked. Stay up to date on all vaccines. This information is not intended to replace advice given to you by your health care provider. Make sure you discuss any questions you have with your health care provider. Document Revised: 11/27/2020 Document Reviewed: 11/27/2020 Elsevier Patient Education  2024 [employer] Inc[employer]  

## 2023-02-03 NOTE — Progress Notes (Signed)
Danielle Galvan - 67 y.o. female MRN [id number]  Date of birth: [birth year]  Subjective Chief Complaint  Patient presents with   Annual Exam    HPI Danielle Galvan is a 67 y.o. female here today for annual exam.   She has been in pretty good health.  Feels pretty good with current strength of levothyroxine.    She does try to stay pretty active.  She has been following an intermittent fasting diet.   She is a non-smoker.  She denies EtOH use at this time.   She is due for colon cancer screening.  Review of Systems  Constitutional:  Negative for chills, fever, malaise/fatigue and weight loss.  HENT:  Negative for congestion, ear pain and sore throat.   Eyes:  Negative for blurred vision, double vision and pain.  Respiratory:  Negative for cough and shortness of breath.   Cardiovascular:  Negative for chest pain and palpitations.  Gastrointestinal:  Negative for abdominal pain, blood in stool, constipation, heartburn and nausea.  Genitourinary:  Negative for dysuria and urgency.  Musculoskeletal:  Negative for joint pain and myalgias.  Neurological:  Negative for dizziness and headaches.  Endo/Heme/Allergies:  Does not bruise/bleed easily.  Psychiatric/Behavioral:  Negative for depression. The patient is not nervous/anxious and does not have insomnia.     Allergies  Allergen Reactions   Sulfa Antibiotics Hives and Rash   Ciprofloxacin    Ivy Leaf [Hedera Helix]    Other Rash    Past Medical History:  Diagnosis Date   Allergy    see file   Hypothyroid 06/19/2014   Shingles 11/2013    Past Surgical History:  Procedure Laterality Date   APPENDECTOMY  11/13/2008    Social History   Socioeconomic History   Marital status: Married    Spouse name: Skylie Dobransky   Number of children: 4   Years of education: 24   Highest education level: Master's degree (e.g., MA, MS, MEng, MEd, MSW, MBA)  Occupational History   Occupation: Retired  Tobacco Use   Smoking status: Never    Smokeless tobacco: Never  Substance and Sexual Activity   Alcohol use: No   Drug use: No   Sexual activity: Not Currently    Partners: Male    Birth control/protection: None  Other Topics Concern   Not on file  Social History Narrative   Lives with her husband. She enjoys RV camping, bicycling, gardening and hiking.    Social Determinants of Health   Financial Resource Strain: Low Risk  (04/05/2022)   Overall Financial Resource Strain (CARDIA)    Difficulty of Paying Living Expenses: Not hard at all  Food Insecurity: No Food Insecurity (04/05/2022)   Hunger Vital Sign    Worried About Running Out of Food in the Last Year: Never true    Ran Out of Food in the Last Year: Never true  Transportation Needs: No Transportation Needs (04/05/2022)   PRAPARE - Administrator, Civil Service (Medical): No    Lack of Transportation (Non-Medical): No  Physical Activity: Sufficiently Active (04/05/2022)   Exercise Vital Sign    Days of Exercise per Week: 6 days    Minutes of Exercise per Session: 100 min  Stress: No Stress Concern Present (04/05/2022)   [employer] of Occupational Health - Occupational Stress Questionnaire    Feeling of Stress : Not at all  Social Connections: Socially Integrated (04/06/2022)   Social Connection and Isolation Panel [NHANES]    Frequency of Communication  with Friends and Family: Never    Frequency of Social Gatherings with Friends and Family: More than three times a week    Attends Religious Services: More than 4 times per year    Active Member of Clubs or Organizations: Yes    Attends Engineer, structural: More than 4 times per year    Marital Status: Married    Family History  Problem Relation Age of Onset   Prostate cancer Father    Dementia Father    Alcohol abuse Brother    Cancer Brother    Obesity Brother    Kidney disease Brother    Obesity Brother     Health Maintenance  Topic Date Due   Colonoscopy   01/28/2023   INFLUENZA VACCINE  01/14/2023   COVID-19 Vaccine (6 - 2023-24 season) 02/19/2023 (Originally 02/13/2022)   Hepatitis C Screening  02/03/2024 (Originally 09/14/1973)   Medicare Annual Wellness (AWV)  07/16/2023   MAMMOGRAM  10/21/2023   DTaP/Tdap/Td (3 - Td or Tdap) 04/22/2025   Pneumonia Vaccine 53+ Years old  Completed   DEXA SCAN  Completed   Zoster Vaccines- Shingrix  Completed   HPV VACCINES  Aged Out     ----------------------------------------------------------------------------------------------------------------------------------------------------------------------------------------------------------------- Physical Exam BP 107/63   Pulse 81   Ht 5\' 8"  (1.727 m)   Wt 188 lb (85.3 kg)   SpO2 96%   BMI 28.59 kg/m   Physical Exam Constitutional:      General: She is not in acute distress. HENT:     Head: Normocephalic and atraumatic.     Right Ear: Tympanic membrane and ear canal normal.     Left Ear: Tympanic membrane and ear canal normal.     Nose: Nose normal.  Eyes:     General: No scleral icterus.    Conjunctiva/sclera: Conjunctivae normal.  Neck:     Thyroid: No thyromegaly.  Cardiovascular:     Rate and Rhythm: Normal rate and regular rhythm.     Heart sounds: Normal heart sounds.  Pulmonary:     Effort: Pulmonary effort is normal.     Breath sounds: Normal breath sounds.  Abdominal:     General: Bowel sounds are normal. There is no distension.     Palpations: Abdomen is soft.     Tenderness: There is no abdominal tenderness. There is no guarding.  Musculoskeletal:        General: Normal range of motion.     Cervical back: Normal range of motion and neck supple.  Lymphadenopathy:     Cervical: No cervical adenopathy.  Skin:    General: Skin is warm and dry.     Findings: No rash.  Neurological:     General: No focal deficit present.     Mental Status: She is alert and oriented to person, place, and time.     Cranial Nerves: No cranial  nerve deficit.     Coordination: Coordination normal.  Psychiatric:        Mood and Affect: Mood normal.        Behavior: Behavior normal.     ------------------------------------------------------------------------------------------------------------------------------------------------------------------------------------------------------------------- Assessment and Plan  Well adult exam Well adult Orders Placed This Encounter  Procedures   TSH   Lipid Panel w/reflex Direct LDL   CBC with Differential/Platelet   COMPLETE METABOLIC PANEL WITH GFR   Ambulatory referral to Gastroenterology    Referral Priority:   Routine    Referral Type:   Consultation    Referral Reason:   Specialty Services  Required    Number of Visits Requested:   1  Screenings: per lab orders.  Referral placed for colon cancer screening Immunizations:  Fluad given today.  Anticipatory guidance/Risk factor reduction:  Recommendations per AVS.    No orders of the defined types were placed in this encounter.   No follow-ups on file.    This visit occurred during the SARS-CoV-2 public health emergency.  Safety protocols were in place, including screening questions prior to the visit, additional usage of staff PPE, and extensive cleaning of exam room while observing appropriate contact time as indicated for disinfecting solutions.

## 2023-02-03 NOTE — Addendum Note (Signed)
Addended by: Carren Rang A on: 02/03/2023 02:42 PM   Modules accepted: Orders

## 2023-02-03 NOTE — Assessment & Plan Note (Signed)
Well adult Orders Placed This Encounter  Procedures   TSH   Lipid Panel w/reflex Direct LDL   CBC with Differential/Platelet   COMPLETE METABOLIC PANEL WITH GFR   Ambulatory referral to Gastroenterology    Referral Priority:   Routine    Referral Type:   Consultation    Referral Reason:   Specialty Services Required    Number of Visits Requested:   1  Screenings: per lab orders.  Referral placed for colon cancer screening Immunizations:  Fluad given today.  Anticipatory guidance/Risk factor reduction:  Recommendations per AVS.

## 2023-02-10 DIAGNOSIS — Z0000 Encounter for general adult medical examination without abnormal findings: Secondary | ICD-10-CM | POA: Diagnosis not present

## 2023-02-10 DIAGNOSIS — E039 Hypothyroidism, unspecified: Secondary | ICD-10-CM | POA: Diagnosis not present

## 2023-02-11 LAB — CBC WITH DIFFERENTIAL/PLATELET
Basophils Absolute: 0.1 10*3/uL (ref 0.0–0.2)
Basos: 1 %
EOS (ABSOLUTE): 0.1 10*3/uL (ref 0.0–0.4)
Eos: 1 %
Hematocrit: 44 % (ref 34.0–46.6)
Hemoglobin: 14.6 g/dL (ref 11.1–15.9)
Immature Grans (Abs): 0 10*3/uL (ref 0.0–0.1)
Immature Granulocytes: 0 %
Lymphocytes Absolute: 1.2 10*3/uL (ref 0.7–3.1)
Lymphs: 32 %
MCH: 29.6 pg (ref 26.6–33.0)
MCHC: 33.2 g/dL (ref 31.5–35.7)
MCV: 89 fL (ref 79–97)
Monocytes Absolute: 0.4 10*3/uL (ref 0.1–0.9)
Monocytes: 11 %
Neutrophils Absolute: 2 10*3/uL (ref 1.4–7.0)
Neutrophils: 55 %
Platelets: 243 10*3/uL (ref 150–450)
RBC: 4.94 x10E6/uL (ref 3.77–5.28)
RDW: 12.9 % (ref 11.7–15.4)
WBC: 3.8 10*3/uL (ref 3.4–10.8)

## 2023-02-11 LAB — TSH: TSH: 0.84 u[IU]/mL (ref 0.450–4.500)

## 2023-02-13 LAB — COMPREHENSIVE METABOLIC PANEL
ALT: 18 IU/L (ref 0–32)
AST: 23 IU/L (ref 0–40)
Albumin: 4.5 g/dL (ref 3.9–4.9)
Alkaline Phosphatase: 67 IU/L (ref 44–121)
BUN/Creatinine Ratio: 16 (ref 12–28)
BUN: 16 mg/dL (ref 8–27)
Bilirubin Total: 0.2 mg/dL (ref 0.0–1.2)
CO2: 21 mmol/L (ref 20–29)
Calcium: 9.9 mg/dL (ref 8.7–10.3)
Chloride: 98 mmol/L (ref 96–106)
Creatinine, Ser: 0.99 mg/dL (ref 0.57–1.00)
Globulin, Total: 2.7 g/dL (ref 1.5–4.5)
Glucose: 89 mg/dL (ref 70–99)
Potassium: 4.3 mmol/L (ref 3.5–5.2)
Sodium: 137 mmol/L (ref 134–144)
Total Protein: 7.2 g/dL (ref 6.0–8.5)
eGFR: 62 mL/min/{1.73_m2} (ref >59)

## 2023-02-13 LAB — LIPID PANEL W/O CHOL/HDL RATIO
Cholesterol, Total: 201 mg/dL — ABNORMAL HIGH (ref 100–199)
HDL: 75 mg/dL (ref >39)
LDL Chol Calc (NIH): 111 mg/dL — ABNORMAL HIGH (ref 0–99)
Triglycerides: 84 mg/dL (ref 0–149)
VLDL Cholesterol Cal: 15 mg/dL (ref 5–40)

## 2023-02-13 LAB — SPECIMEN STATUS REPORT

## 2023-02-17 ENCOUNTER — Other Ambulatory Visit: Payer: Self-pay | Admitting: Family Medicine

## 2023-02-17 DIAGNOSIS — J3089 Other allergic rhinitis: Secondary | ICD-10-CM | POA: Diagnosis not present

## 2023-02-19 DIAGNOSIS — J3089 Other allergic rhinitis: Secondary | ICD-10-CM | POA: Diagnosis not present

## 2023-02-25 ENCOUNTER — Telehealth: Payer: Self-pay | Admitting: Gastroenterology

## 2023-02-25 NOTE — Telephone Encounter (Signed)
Hi Dr. Tomasa Rand,  (Supervising Doc, 02/25/23, AM)  This patient came into the office today requesting a transfer of care to [place].  She brought in her last colonoscopy/pathology report which I am sending to you for your review.  As I was not here when she presented her records, I am unsure of her reasoning for the switch.  Please advise how you would like me to proceed.  Thank you.

## 2023-03-03 DIAGNOSIS — J3089 Other allergic rhinitis: Secondary | ICD-10-CM | POA: Diagnosis not present

## 2023-03-10 ENCOUNTER — Encounter: Payer: Self-pay | Admitting: Gastroenterology

## 2023-03-24 DIAGNOSIS — J3089 Other allergic rhinitis: Secondary | ICD-10-CM | POA: Diagnosis not present

## 2023-04-07 DIAGNOSIS — J3089 Other allergic rhinitis: Secondary | ICD-10-CM | POA: Diagnosis not present

## 2023-04-12 ENCOUNTER — Ambulatory Visit (INDEPENDENT_AMBULATORY_CARE_PROVIDER_SITE_OTHER): Payer: Medicare PPO | Admitting: Family Medicine

## 2023-04-12 DIAGNOSIS — Z0000 Encounter for general adult medical examination without abnormal findings: Secondary | ICD-10-CM

## 2023-04-12 NOTE — Progress Notes (Signed)
MEDICARE ANNUAL WELLNESS VISIT  04/12/2023  Telephone Visit Disclaimer This Medicare AWV was conducted by telephone due to national recommendations for restrictions regarding the COVID-19 Pandemic (e.g. social distancing).  I verified, using two identifiers, that I am speaking with Danielle Galvan or their authorized healthcare agent. I discussed the limitations, risks, security, and privacy concerns of performing an evaluation and management service by telephone and the potential availability of an in-person appointment in the future. The patient expressed understanding and agreed to proceed.  Location of Patient: Home Location of Provider (nurse):  In the office.  Subjective:    Danielle Galvan is a 67 y.o. female patient of Danielle Coombe, DO who had a Medicare Annual Wellness Visit today via telephone. Mande is Retired and lives with their spouse. she has 4 children. she reports that she is socially active and does interact with friends/family regularly. she is moderately physically active and enjoys RV camping, bicycling, gardening and hiking.   Patient Care Team: Danielle Coombe, DO as PCP - General (Family Medicine)     04/12/2023   12:59 PM 04/06/2022    1:07 PM 11/16/2018    3:40 PM  Advanced Directives  Does Patient Have a Medical Advance Directive? Yes Yes Yes  Type of Estate agent of [place];Living will Living will Healthcare Power of [place];Living will  Does patient want to make changes to medical advance directive? No - Patient declined No - Patient declined No - Patient declined  Copy of Healthcare Power of Attorney in Chart? No - copy requested  No - copy requested    Hospital Utilization Over the Past 12 Months: # of hospitalizations or ER visits: 0 # of surgeries: 0  Review of Systems    Patient reports that her overall health is better compared to last year.  History obtained from chart review and the patient  Patient Reported  Readings (BP, Pulse, CBG, Weight, etc) none Per patient no change in vitals since last visit, unable to obtain new vitals due to telehealth visit  Pain Assessment       Current Medications & Allergies (verified) Allergies as of 04/12/2023       Reactions   Sulfa Antibiotics Hives, Rash   Ciprofloxacin    Ivy Leaf [hedera Helix]    Other Rash        Medication List        Accurate as of April 12, 2023  1:11 PM. If you have any questions, ask your nurse or doctor.          Clobetasol Prop Emollient Base 0.05 % emollient cream   levothyroxine 100 MCG tablet Commonly known as: SYNTHROID TAKE 1 TABLET BY MOUTH ONCE DAILY BEFORE BREAKFAST   Tretinoin 0.05 % Lotn        History (reviewed): Past Medical History:  Diagnosis Date   Allergy    see file   Hypertension 01/2023   Not a diagnosis; precaution   Hypothyroid 06/19/2014   Shingles 11/2013   Past Surgical History:  Procedure Laterality Date   APPENDECTOMY  11/13/2008   Family History  Problem Relation Age of Onset   Prostate cancer Father    Dementia Father    Cancer Father    Alcohol abuse Brother    Cancer Brother    Obesity Brother    Kidney disease Brother    Obesity Brother    Obesity Brother    Varicose Veins Mother    Early death Maternal Grandmother  Birth defects Daughter    Birth defects Son    Diabetes Maternal Uncle    Social History   Socioeconomic History   Marital status: Married    Spouse name: Shaheen Suchanek   Number of children: 4   Years of education: 24   Highest education level: Doctorate  Occupational History   Occupation: Retired  Tobacco Use   Smoking status: Never   Smokeless tobacco: Never  Vaping Use   Vaping status: Never Used  Substance and Sexual Activity   Alcohol use: No   Drug use: No   Sexual activity: Not Currently    Partners: Male    Birth control/protection: None  Other Topics Concern   Not on file  Social History Narrative   Lives  with her husband. She enjoys RV camping, bicycling, gardening and hiking.    Social Determinants of Health   Financial Resource Strain: Low Risk  (04/08/2023)   Overall Financial Resource Strain (CARDIA)    Difficulty of Paying Living Expenses: Not hard at all  Food Insecurity: No Food Insecurity (04/08/2023)   Hunger Vital Sign    Worried About Running Out of Food in the Last Year: Never true    Ran Out of Food in the Last Year: Never true  Transportation Needs: No Transportation Needs (04/08/2023)   PRAPARE - Administrator, Civil Service (Medical): No    Lack of Transportation (Non-Medical): No  Physical Activity: Sufficiently Active (04/08/2023)   Exercise Vital Sign    Days of Exercise per Week: 5 days    Minutes of Exercise per Session: 60 min  Stress: No Stress Concern Present (04/08/2023)   [employer] of Occupational Health - Occupational Stress Questionnaire    Feeling of Stress : Not at all  Social Connections: Socially Integrated (04/12/2023)   Social Connection and Isolation Panel [NHANES]    Frequency of Communication with Friends and Family: Never    Frequency of Social Gatherings with Friends and Family: More than three times a week    Attends Religious Services: More than 4 times per year    Active Member of [employer] or Organizations: Yes    Attends Banker Meetings: More than 4 times per year    Marital Status: Married    Activities of Daily Living    04/08/2023    4:43 PM  In your present state of health, do you have any difficulty performing the following activities:  Hearing? 0  Vision? 0  Difficulty concentrating or making decisions? 0  Walking or climbing stairs? 0  Dressing or bathing? 0  Doing errands, shopping? 0  Preparing Food and eating ? N  Using the Toilet? N  In the past six months, have you accidently leaked urine? Y  Do you have problems with loss of bowel control? N  Managing your Medications? N  Managing  your Finances? N  Housekeeping or managing your Housekeeping? N    Patient Education/ Literacy How often do you need to have someone help you when you read instructions, pamphlets, or other written materials from your doctor or pharmacy?: 1 - Never What is the last grade level you completed in school?: PhD  Exercise    Diet Patient reports consuming  3  meals a day and 0 snack(s) a day Patient reports that her primary diet is: Regular Patient reports that she does have regular access to food.   Depression Screen    04/12/2023    1:02 PM 04/06/2022  1:07 PM 05/07/2021    2:11 PM 01/07/2021    8:28 AM 12/19/2019    1:32 PM 11/16/2018    3:40 PM 02/24/2017    3:40 PM  PHQ 2/9 Scores  PHQ - 2 Score 0 0 0 0 1 0 0  PHQ- 9 Score     1  2     Fall Risk    04/12/2023    1:01 PM 04/08/2023    4:43 PM 04/06/2022    1:07 PM 04/05/2022    5:24 AM 05/07/2021    2:11 PM  Fall Risk   Falls in the past year? 0 0 1 0 0  Number falls in past yr: 0 0 0 0 0  Injury with Fall? 0 0 0 0 0  Risk for fall due to : No Fall Risks  Other (Comment)  No Fall Risks  Risk for fall due to: Comment   while riding her bike.    Follow up Falls evaluation completed  Falls evaluation completed;Education provided;Falls prevention discussed  Falls evaluation completed     Objective:  Merridy Bedonie seemed alert and oriented and she participated appropriately during our telephone visit.  Blood Pressure Weight BMI  BP Readings from Last 3 Encounters:  02/03/23 107/63  03/12/22 119/77  01/28/22 118/71   Wt Readings from Last 3 Encounters:  02/03/23 188 lb (85.3 kg)  03/12/22 187 lb (84.8 kg)  01/28/22 189 lb (85.7 kg)   BMI Readings from Last 1 Encounters:  02/03/23 28.59 kg/m    *Unable to obtain current vital signs, weight, and BMI due to telephone visit type  Hearing/Vision  Carren did not seem to have difficulty with hearing/understanding during the telephone conversation Reports that she has  had a formal eye exam by an eye care professional within the past year Reports that she has not had a formal hearing evaluation within the past year *Unable to fully assess hearing and vision during telephone visit type  Cognitive Function:    04/12/2023    1:05 PM 04/06/2022    1:14 PM  6CIT Screen  What Year? 0 points 0 points  What month? 0 points 0 points  What time? 0 points 0 points  Count back from 20 0 points 0 points  Months in reverse 0 points 0 points  Repeat phrase 0 points 0 points  Total Score 0 points 0 points   (Normal:0-7, Significant for Dysfunction: >8)  Normal Cognitive Function Screening: Yes   Immunization & Health Maintenance Record Immunization History  Administered Date(s) Administered   Fluad Quad(high Dose 65+) 02/24/2019, 02/03/2023   Hepatitis B 04/22/2012, 08/17/2013   Influenza, Quadrivalent, Recombinant, Inj, Pf 03/22/2018   Influenza,inj,Quad PF,6+ Mos 03/19/2020   Influenza-Unspecified 04/23/2015, 07/15/2016, 03/27/2022   MMR 02/02/1994   PFIZER Comirnaty(Gray Top)Covid-19 Tri-Sucrose Vaccine 10/01/2020   PFIZER(Purple Top)SARS-COV-2 Vaccination 07/12/2019, 08/02/2019, 04/05/2020   PNEUMOCOCCAL CONJUGATE-20 12/04/2021   PPD Test 01/23/2015   Pfizer Covid-19 Vaccine Bivalent Booster 37yrs & up 12/04/2021   Pneumococcal Polysaccharide-23 11/16/2018   Td 01/17/2004   Tdap 04/23/2015   Zoster Recombinant(Shingrix) 03/19/2020, 10/01/2020    Health Maintenance  Topic Date Due   COVID-19 Vaccine (6 - 2023-24 season) 04/28/2023 (Originally 02/14/2023)   Hepatitis C Screening  02/03/2024 (Originally 09/14/1973)   Colonoscopy  04/11/2024 (Originally 01/28/2023)   MAMMOGRAM  10/21/2023   Medicare Annual Wellness (AWV)  04/11/2024   DTaP/Tdap/Td (3 - Td or Tdap) 04/22/2025   DEXA SCAN  07/16/2027   Pneumonia Vaccine 65+  Years old  Completed   INFLUENZA VACCINE  Completed   Zoster Vaccines- Shingrix  Completed   HPV VACCINES  Aged Out        Assessment  This is a routine wellness examination for [employer]  Health Maintenance: Due or Overdue There are no preventive care reminders to display for this patient.   Shareefah Schuerger does not need a referral for Community Assistance: Care Management:   no Social Work:    no Prescription Assistance:  no Nutrition/Diabetes Education:  no   Plan:  Personalized Goals  Goals Addressed               This Visit's Progress     Patient Stated (pt-stated)        Patient stated that she would like to loose 10lbs; she would like to get three miles on her treadmill and continue to maintain her weight loss.       Personalized Health Maintenance & Screening Recommendations  Colorectal cancer screening - scheduled in november  Lung Cancer Screening Recommended: no (Low Dose CT Chest recommended if Age 65-80 years, 20 pack-year currently smoking OR have quit w/in past 15 years) Hepatitis C Screening recommended: yes HIV Screening recommended: no  Advanced Directives: Written information was not prepared per patient's request.  Referrals & Orders No orders of the defined types were placed in this encounter.   Follow-up Plan Follow-up with Danielle Coombe, DO as planned Medicare wellness visit in one year.  Patient will access AVS on my chart.   I have personally reviewed and noted the following in the patient's chart:   Medical and social history Use of alcohol, tobacco or illicit drugs  Current medications and supplements Functional ability and status Nutritional status Physical activity Advanced directives List of other physicians Hospitalizations, surgeries, and ER visits in previous 12 months Vitals Screenings to include cognitive, depression, and falls Referrals and appointments  In addition, I have reviewed and discussed with Danielle Galvan certain preventive protocols, quality metrics, and best practice recommendations. A written personalized care plan for  preventive services as well as general preventive health recommendations is available and can be mailed to the patient at her request.      Modesto Charon, RN BSN  04/12/2023

## 2023-04-12 NOTE — Patient Instructions (Addendum)
MEDICARE ANNUAL WELLNESS VISIT Health Maintenance Summary and Written Plan of Care  Danielle Galvan ,  Thank you for allowing me to perform your Medicare Annual Wellness Visit and for your ongoing commitment to your health.   Health Maintenance & Immunization History Health Maintenance  Topic Date Due   COVID-19 Vaccine (6 - 2023-24 season) 04/28/2023 (Originally 02/14/2023)   Hepatitis C Screening  02/03/2024 (Originally 09/14/1973)   Colonoscopy  04/11/2024 (Originally 01/28/2023)   MAMMOGRAM  10/21/2023   Medicare Annual Wellness (AWV)  04/11/2024   DTaP/Tdap/Td (3 - Td or Tdap) 04/22/2025   DEXA SCAN  07/16/2027   Pneumonia Vaccine 43+ Years old  Completed   INFLUENZA VACCINE  Completed   Zoster Vaccines- Shingrix  Completed   HPV VACCINES  Aged Out   Immunization History  Administered Date(s) Administered   Fluad Quad(high Dose 65+) 02/24/2019, 02/03/2023   Hepatitis B 04/22/2012, 08/17/2013   Influenza, Quadrivalent, Recombinant, Inj, Pf 03/22/2018   Influenza,inj,Quad PF,6+ Mos 03/19/2020   Influenza-Unspecified 04/23/2015, 07/15/2016, 03/27/2022   MMR 02/02/1994   PFIZER Comirnaty(Gray Top)Covid-19 Tri-Sucrose Vaccine 10/01/2020   PFIZER(Purple Top)SARS-COV-2 Vaccination 07/12/2019, 08/02/2019, 04/05/2020   PNEUMOCOCCAL CONJUGATE-20 12/04/2021   PPD Test 01/23/2015   Pfizer Covid-19 Vaccine Bivalent Booster 27yrs & up 12/04/2021   Pneumococcal Polysaccharide-23 11/16/2018   Td 01/17/2004   Tdap 04/23/2015   Zoster Recombinant(Shingrix) 03/19/2020, 10/01/2020    These are the patient goals that we discussed:  Goals Addressed               This Visit's Progress     Patient Stated (pt-stated)        Patient stated that she would like to loose 10lbs; she would like to get three miles on her treadmill and continue to maintain her weight loss.         This is a list of Health Maintenance Items that are overdue or due now: Colorectal cancer screening - scheduled in  november    Orders/Referrals Placed Today: No orders of the defined types were placed in this encounter.  (Contact our referral department at [phone] if you have not spoken with someone about your referral appointment within the next 5 days)    Follow-up Plan Follow-up with Everrett Coombe, DO as planned Medicare wellness visit in one year.  Patient will access AVS on my chart.      Health Maintenance, Female Adopting a healthy lifestyle and getting preventive care are important in promoting health and wellness. Ask your health care provider about: The right schedule for you to have regular tests and exams. Things you can do on your own to prevent diseases and keep yourself healthy. What should I know about diet, weight, and exercise? Eat a healthy diet  Eat a diet that includes plenty of vegetables, fruits, low-fat dairy products, and lean protein. Do not eat a lot of foods that are high in solid fats, added sugars, or sodium. Maintain a healthy weight Body mass index (BMI) is used to identify weight problems. It estimates body fat based on height and weight. Your health care provider can help determine your BMI and help you achieve or maintain a healthy weight. Get regular exercise Get regular exercise. This is one of the most important things you can do for your health. Most adults should: Exercise for at least 150 minutes each week. The exercise should increase your heart rate and make you sweat (moderate-intensity exercise). Do strengthening exercises at least twice a week. This is in addition  to the moderate-intensity exercise. Spend less time sitting. Even light physical activity can be beneficial. Watch cholesterol and blood lipids Have your blood tested for lipids and cholesterol at 69 years of age, then have this test every 5 years. Have your cholesterol levels checked more often if: Your lipid or cholesterol levels are high. You are older than 69 years of age. You  are at high risk for heart disease. What should I know about cancer screening? Depending on your health history and family history, you may need to have cancer screening at various ages. This may include screening for: Breast cancer. Cervical cancer. Colorectal cancer. Skin cancer. Lung cancer. What should I know about heart disease, diabetes, and high blood pressure? Blood pressure and heart disease High blood pressure causes heart disease and increases the risk of stroke. This is more likely to develop in people who have high blood pressure readings or are overweight. Have your blood pressure checked: Every 3-5 years if you are 69-69 years of age. Every year if you are 69 years old or older. Diabetes Have regular diabetes screenings. This checks your fasting blood sugar level. Have the screening done: Once every three years after age 13 if you are at a normal weight and have a low risk for diabetes. More often and at a younger age if you are overweight or have a high risk for diabetes. What should I know about preventing infection? Hepatitis B If you have a higher risk for hepatitis B, you should be screened for this virus. Talk with your health care provider to find out if you are at risk for hepatitis B infection. Hepatitis C Testing is recommended for: Everyone born from [birth year] through 1967. Anyone with known risk factors for hepatitis C. Sexually transmitted infections (STIs) Get screened for STIs, including gonorrhea and chlamydia, if: You are sexually active and are younger than 69 years of age. You are older than 69 years of age and your health care provider tells you that you are at risk for this type of infection. Your sexual activity has changed since you were last screened, and you are at increased risk for chlamydia or gonorrhea. Ask your health care provider if you are at risk. Ask your health care provider about whether you are at high risk for HIV. Your health care  provider may recommend a prescription medicine to help prevent HIV infection. If you choose to take medicine to prevent HIV, you should first get tested for HIV. You should then be tested every 3 months for as long as you are taking the medicine. Pregnancy If you are about to stop having your period (premenopausal) and you may become pregnant, seek counseling before you get pregnant. Take 400 to 800 micrograms (mcg) of folic acid every day if you become pregnant. Ask for birth control (contraception) if you want to prevent pregnancy. Osteoporosis and menopause Osteoporosis is a disease in which the bones lose minerals and strength with aging. This can result in bone fractures. If you are 26 years old or older, or if you are at risk for osteoporosis and fractures, ask your health care provider if you should: Be screened for bone loss. Take a calcium or vitamin D supplement to lower your risk of fractures. Be given hormone replacement therapy (HRT) to treat symptoms of menopause. Follow these instructions at home: Alcohol use Do not drink alcohol if: Your health care provider tells you not to drink. You are pregnant, may be pregnant, or are planning to  become pregnant. If you drink alcohol: Limit how much you have to: 0-1 drink a day. Know how much alcohol is in your drink. In the [place] one drink equals one 12 oz bottle of beer (355 mL), one 5 oz glass of wine (148 mL), or one 1 oz glass of hard liquor (44 mL). Lifestyle Do not use any products that contain nicotine or tobacco. These products include cigarettes, chewing tobacco, and vaping devices, such as e-cigarettes. If you need help quitting, ask your health care provider. Do not use street drugs. Do not share needles. Ask your health care provider for help if you need support or information about quitting drugs. General instructions Schedule regular health, dental, and eye exams. Stay current with your vaccines. Tell your health care  provider if: You often feel depressed. You have ever been abused or do not feel safe at home. Summary Adopting a healthy lifestyle and getting preventive care are important in promoting health and wellness. Follow your health care provider's instructions about healthy diet, exercising, and getting tested or screened for diseases. Follow your health care provider's instructions on monitoring your cholesterol and blood pressure. This information is not intended to replace advice given to you by your health care provider. Make sure you discuss any questions you have with your health care provider. Document Revised: 10/21/2020 Document Reviewed: 10/21/2020 Elsevier Patient Education  2024 [employer].

## 2023-04-13 ENCOUNTER — Ambulatory Visit (AMBULATORY_SURGERY_CENTER): Payer: Medicare PPO

## 2023-04-13 VITALS — Ht 67.0 in | Wt 183.0 lb

## 2023-04-13 DIAGNOSIS — Z800 Family history of malignant neoplasm of digestive organs: Secondary | ICD-10-CM

## 2023-04-13 DIAGNOSIS — Z1211 Encounter for screening for malignant neoplasm of colon: Secondary | ICD-10-CM

## 2023-04-13 MED ORDER — NA SULFATE-K SULFATE-MG SULF 17.5-3.13-1.6 GM/177ML PO SOLN: 1.0000 | Freq: Once | ORAL | 0 refills | Status: AC

## 2023-04-13 NOTE — Progress Notes (Signed)
Pre visit completed via phone call; Patient verified name, DOB, and address; No egg or soy allergy known to patient;  No issues known to pt with past sedation with any surgeries or procedures; Patient denies ever being told they had issues or difficulty with intubation;  No FH of Malignant Hyperthermia; Pt is not on diet pills; Pt is not on home 02;  Pt is not on blood thinners;  Pt denies issues with constipation  No A fib or A flutter; Have any cardiac testing pending--NO Insurance verified during PV appt--- [employer] Pt can ambulate without assistance;  Pt denies use of chewing tobacco; Discussed diabetic/weight loss medication holds; Discussed NSAID holds; Checked BMI to be less than 50; Pt instructed to use Singlecare.com or GoodRx for a price reduction on prep;  Patient's chart reviewed by Cathlyn Parsons CNRA prior to previsit and patient appropriate for the [facility].  Pre visit completed and red dot placed by patient's name on their procedure day (on provider's schedule).    Instructions sent to MyChart per patient request;

## 2023-04-28 DIAGNOSIS — J3089 Other allergic rhinitis: Secondary | ICD-10-CM | POA: Diagnosis not present

## 2023-05-04 ENCOUNTER — Ambulatory Visit: Payer: Medicare PPO | Admitting: Gastroenterology

## 2023-05-04 ENCOUNTER — Encounter: Payer: Self-pay | Admitting: Gastroenterology

## 2023-05-04 VITALS — BP 119/64 | HR 62 | Temp 97.3°F | Resp 14 | Ht 67.0 in | Wt 183.0 lb

## 2023-05-04 DIAGNOSIS — Z09 Encounter for follow-up examination after completed treatment for conditions other than malignant neoplasm: Secondary | ICD-10-CM | POA: Diagnosis not present

## 2023-05-04 DIAGNOSIS — Z800 Family history of malignant neoplasm of digestive organs: Secondary | ICD-10-CM | POA: Diagnosis not present

## 2023-05-04 DIAGNOSIS — Z1211 Encounter for screening for malignant neoplasm of colon: Secondary | ICD-10-CM | POA: Diagnosis not present

## 2023-05-04 DIAGNOSIS — Z860100 Personal history of colon polyps, unspecified: Secondary | ICD-10-CM

## 2023-05-04 DIAGNOSIS — E039 Hypothyroidism, unspecified: Secondary | ICD-10-CM | POA: Diagnosis not present

## 2023-05-04 DIAGNOSIS — I10 Essential (primary) hypertension: Secondary | ICD-10-CM | POA: Diagnosis not present

## 2023-05-04 MED ORDER — SODIUM CHLORIDE 0.9 % IV SOLN: 500.0000 mL | Freq: Once | INTRAVENOUS | Status: AC

## 2023-05-04 NOTE — Patient Instructions (Addendum)
Continue present medications.  Repeat colonoscopy in 7-10 years for surveillance.   YOU HAD AN ENDOSCOPIC PROCEDURE TODAY AT THE [facility]:   Refer to the procedure report that was given to you for any specific questions about what was found during the examination.  If the procedure report does not answer your questions, please call your gastroenterologist to clarify.  If you requested that your care partner not be given the details of your procedure findings, then the procedure report has been included in a sealed envelope for you to review at your convenience later.  YOU SHOULD EXPECT: Some feelings of bloating in the abdomen. Passage of more gas than usual.  Walking can help get rid of the air that was put into your GI tract during the procedure and reduce the bloating. If you had a lower endoscopy (such as a colonoscopy or flexible sigmoidoscopy) you may notice spotting of blood in your stool or on the toilet paper. If you underwent a bowel prep for your procedure, you may not have a normal bowel movement for a few days.  Please Note:  You might notice some irritation and congestion in your nose or some drainage.  This is from the oxygen used during your procedure.  There is no need for concern and it should clear up in a day or so.  SYMPTOMS TO REPORT IMMEDIATELY:  Following lower endoscopy (colonoscopy or flexible sigmoidoscopy):  Excessive amounts of blood in the stool  Significant tenderness or worsening of abdominal pains  Swelling of the abdomen that is new, acute  Fever of 100F or higher For urgent or emergent issues, a gastroenterologist can be reached at any hour by calling [phone]. Do not use MyChart messaging for urgent concerns.    DIET:  We do recommend a small meal at first, but then you may proceed to your regular diet.  Drink plenty of fluids but you should avoid alcoholic beverages for 24 hours.  ACTIVITY:  You should plan to take it easy for the  rest of today and you should NOT DRIVE or use heavy machinery until tomorrow (because of the sedation medicines used during the test).    FOLLOW UP: Our staff will call the number listed on your records the next business day following your procedure.  We will call around 7:15- 8:00 am to check on you and address any questions or concerns that you may have regarding the information given to you following your procedure. If we do not reach you, we will leave a message.     If any biopsies were taken you will be contacted by phone or by letter within the next 1-3 weeks.  Please call us at [phone] if you have not heard about the biopsies in 3 weeks.    SIGNATURES/CONFIDENTIALITY: You and/or your care partner have signed paperwork which will be entered into your electronic medical record.  These signatures attest to the fact that that the information above on your After Visit Summary has been reviewed and is understood.  Full responsibility of the confidentiality of this discharge information lies with you and/or your care-partner.

## 2023-05-04 NOTE — Op Note (Signed)
[facility] Patient Name: Danielle Galvan Procedure Date: 05/04/2023 11:57 AM MRN: [id number] Endoscopist: Lorin Picket E. Tomasa Rand , MD, [id number] Age: 67 Referring MD:  Date of Birth: Feb 16, 1956 Gender: Female Account #: [id number] Procedure:                Colonoscopy Indications:              Surveillance: Personal history of colonic polyps                            (unknown histology) on last colonoscopy 5 years                            ago, High risk colon cancer surveillance: Personal                            history of non-advanced adenoma Medicines:                Monitored Anesthesia Care Procedure:                Pre-Anesthesia Assessment:                           - Prior to the procedure, a History and Physical                            was performed, and patient medications and                            allergies were reviewed. The patient's tolerance of                            previous anesthesia was also reviewed. The risks                            and benefits of the procedure and the sedation                            options and risks were discussed with the patient.                            All questions were answered, and informed consent                            was obtained. Prior Anticoagulants: The patient has                            taken no anticoagulant or antiplatelet agents. ASA                            Grade Assessment: II - A patient with mild systemic                            disease. After reviewing the risks and benefits,  the patient was deemed in satisfactory condition to                            undergo the procedure.                           After obtaining informed consent, the colonoscope                            was passed under direct vision. Throughout the                            procedure, the patient's blood pressure, pulse, and                            oxygen saturations were  monitored continuously. The                            CF [id number] #[id number] was introduced through the anus                            and advanced to the the terminal ileum, with                            identification of the appendiceal orifice and IC                            valve. The colonoscopy was performed without                            difficulty. The patient tolerated the procedure                            well. The quality of the bowel preparation was                            good. The terminal ileum, ileocecal valve,                            appendiceal orifice, and rectum were photographed.                            The bowel preparation used was SUPREP via split                            dose instruction. Scope In: 12:05:38 PM Scope Out: 12:22:53 PM Scope Withdrawal Time: 0 hours 8 minutes 23 seconds  Total Procedure Duration: 0 hours 17 minutes 15 seconds  Findings:                 The perianal and digital rectal examinations were                            normal. Pertinent negatives include normal  sphincter tone and no palpable rectal lesions.                           A single medium-mouthed diverticulum was found in                            the transverse colon.                           The exam was otherwise normal throughout the                            examined colon.                           The terminal ileum appeared normal.                           Non-bleeding internal hemorrhoids were found during                            retroflexion. The hemorrhoids were Grade I                            (internal hemorrhoids that do not prolapse).                           No additional abnormalities were found on                            retroflexion. Complications:            No immediate complications. Estimated Blood Loss:     Estimated blood loss: none. Impression:               - Diverticulosis in the transverse  colon.                           - The examined portion of the ileum was normal.                           - Non-bleeding internal hemorrhoids.                           - No specimens collected. Recommendation:           - Patient has a contact number available for                            emergencies. The signs and symptoms of potential                            delayed complications were discussed with the                            patient. Return to normal activities tomorrow.  Written discharge instructions were provided to the                            patient.                           - Resume previous diet.                           - Continue present medications.                           - Repeat colonoscopy in 7-10 years for surveillance. Nyelle Wolfson E. Tomasa Rand, MD 05/04/2023 12:27:54 PM This report has been signed electronically.

## 2023-05-04 NOTE — Progress Notes (Signed)
[facility] History and Physical   Primary Care Physician:  Everrett Coombe, DO   Reason for Procedure:   History of colon polyps  Plan:    Surveillance colonoscopy     HPI: Danielle Galvan is a 67 y.o. female undergoing surveillance colonoscopy.  She had a precancerous polyp removed in 2019 and was recommended to repeat in 5 years.  She had a normal colonoscopy in 2014.  She has no first degree relatives with colon cancer.  She has no chronic GI symptoms.    Past Medical History:  Diagnosis Date   Allergy    see file   Hypertension 01/2023   Not a diagnosis; precaution   Hypothyroid 06/19/2014   Shingles 11/2013    Past Surgical History:  Procedure Laterality Date   APPENDECTOMY  2010   COLONOSCOPY  2019    Prior to Admission medications   Medication Sig Start Date End Date Taking? Authorizing Provider  levothyroxine (SYNTHROID) 100 MCG tablet TAKE 1 TABLET BY MOUTH ONCE DAILY BEFORE BREAKFAST 02/17/23  Yes Everrett Coombe, DO  Tretinoin 0.05 % LOTN Apply 1 application  topically 3 (three) times a week. 11/14/19  Yes [provider]  Clobetasol Prop Emollient Base 0.05 % emollient cream  01/25/22   [provider]    Current Outpatient Medications  Medication Sig Dispense Refill   levothyroxine (SYNTHROID) 100 MCG tablet TAKE 1 TABLET BY MOUTH ONCE DAILY BEFORE BREAKFAST 90 tablet 3   Tretinoin 0.05 % LOTN Apply 1 application  topically 3 (three) times a week.     Clobetasol Prop Emollient Base 0.05 % emollient cream  (Patient not taking: Reported on 04/13/2023)     Current Facility-Administered Medications  Medication Dose Route Frequency Provider Last Rate Last Admin   0.9 %  sodium chloride infusion  500 mL Intravenous Once Jenel Lucks, MD        Allergies as of 05/04/2023 - Review Complete 05/04/2023  Allergen Reaction Noted   Sulfa antibiotics Hives and Rash 12/03/2013   Ciprofloxacin  06/19/2014   Ivy leaf [hedera helix]   04/23/2015    Family History  Problem Relation Age of Onset   Varicose Veins Mother    Prostate cancer Father    Dementia Father    Alcohol abuse Brother    Cancer Brother    Obesity Brother    Kidney disease Brother    Obesity Brother    Obesity Brother    Diabetes Maternal Uncle    Early death Maternal Grandmother    Colon cancer Paternal Grandfather 40   Colon polyps Paternal Grandfather 31   Birth defects Daughter    Birth defects Son    Rectal cancer Neg Hx    Stomach cancer Neg Hx     Social History   Socioeconomic History   Marital status: Married    Spouse name: Flara Besler   Number of children: 4   Years of education: 24   Highest education level: Doctorate  Occupational History   Occupation: Retired  Tobacco Use   Smoking status: Never   Smokeless tobacco: Never  Vaping Use   Vaping status: Never Used  Substance and Sexual Activity   Alcohol use: No   Drug use: No   Sexual activity: Not Currently    Partners: Male    Birth control/protection: None  Other Topics Concern   Not on file  Social History Narrative   Lives with her husband. She enjoys RV camping, bicycling, gardening and hiking.  Social Determinants of Health   Financial Resource Strain: Low Risk  (04/08/2023)   Overall Financial Resource Strain (CARDIA)    Difficulty of Paying Living Expenses: Not hard at all  Food Insecurity: No Food Insecurity (04/08/2023)   Hunger Vital Sign    Worried About Running Out of Food in the Last Year: Never true    Ran Out of Food in the Last Year: Never true  Transportation Needs: No Transportation Needs (04/08/2023)   PRAPARE - Administrator, Civil Service (Medical): No    Lack of Transportation (Non-Medical): No  Physical Activity: Sufficiently Active (04/08/2023)   Exercise Vital Sign    Days of Exercise per Week: 5 days    Minutes of Exercise per Session: 60 min  Stress: No Stress Concern Present (04/08/2023)   [employer]  of Occupational Health - Occupational Stress Questionnaire    Feeling of Stress : Not at all  Social Connections: Socially Integrated (04/12/2023)   Social Connection and Isolation Panel [NHANES]    Frequency of Communication with Friends and Family: Never    Frequency of Social Gatherings with Friends and Family: More than three times a week    Attends Religious Services: More than 4 times per year    Active Member of [employer] or Organizations: Yes    Attends Engineer, structural: More than 4 times per year    Marital Status: Married  Catering manager Violence: Not At Risk (04/12/2023)   Humiliation, Afraid, Rape, and Kick questionnaire    Fear of Current or Ex-Partner: No    Emotionally Abused: No    Physically Abused: No    Sexually Abused: No    Review of Systems:  All other review of systems negative except as mentioned in the HPI.  Physical Exam: Vital signs BP 131/75   Pulse 70   Temp (!) 97.3 F (36.3 C)   Ht 5\' 7"  (1.702 m)   Wt 183 lb (83 kg)   SpO2 95%   BMI 28.66 kg/m   General:   Alert,  Well-developed, well-nourished, pleasant and cooperative in NAD Airway:  Mallampati 2 Lungs:  Clear throughout to auscultation.   Heart:  Regular rate and rhythm; no murmurs, clicks, rubs,  or gallops. Abdomen:  Soft, nontender and nondistended. Normal bowel sounds.   Neuro/Psych:  Normal mood and affect. A and O x 3   Hazim Treadway E. Tomasa Rand, MD [facility]

## 2023-05-04 NOTE — Progress Notes (Signed)
Pt's states no medical or surgical changes since previsit or office visit. 

## 2023-05-04 NOTE — Progress Notes (Signed)
Sedate, gd SR, tolerated procedure well, VSS, report to RN 

## 2023-05-05 ENCOUNTER — Telehealth: Payer: Self-pay | Admitting: *Deleted

## 2023-05-05 NOTE — Telephone Encounter (Signed)
Left message on f/u call 

## 2023-05-19 DIAGNOSIS — J3089 Other allergic rhinitis: Secondary | ICD-10-CM | POA: Diagnosis not present

## 2023-06-02 DIAGNOSIS — J3089 Other allergic rhinitis: Secondary | ICD-10-CM | POA: Diagnosis not present

## 2023-06-04 ENCOUNTER — Telehealth: Payer: Self-pay

## 2023-06-04 NOTE — Telephone Encounter (Signed)
She reports having mild symptoms. She agreed to treatment plan. She wanted to wish [place] a Merry Christmas.

## 2023-06-04 NOTE — Telephone Encounter (Signed)
Copied from CRM [id number]. Topic: Clinical - Medical Advice >> Jun 04, 2023 10:50 AM Hector Shade B wrote: Reason for CRM: Patient has tested positive for COVID she needs to find out what she should do next.

## 2023-06-23 DIAGNOSIS — J3089 Other allergic rhinitis: Secondary | ICD-10-CM | POA: Diagnosis not present

## 2023-07-07 DIAGNOSIS — J3089 Other allergic rhinitis: Secondary | ICD-10-CM | POA: Diagnosis not present

## 2023-07-21 DIAGNOSIS — J3089 Other allergic rhinitis: Secondary | ICD-10-CM | POA: Diagnosis not present

## 2023-08-11 DIAGNOSIS — J3089 Other allergic rhinitis: Secondary | ICD-10-CM | POA: Diagnosis not present

## 2023-08-16 DIAGNOSIS — J3089 Other allergic rhinitis: Secondary | ICD-10-CM | POA: Diagnosis not present

## 2023-09-01 DIAGNOSIS — J3089 Other allergic rhinitis: Secondary | ICD-10-CM | POA: Diagnosis not present

## 2023-09-09 ENCOUNTER — Other Ambulatory Visit: Payer: Self-pay | Admitting: Family Medicine

## 2023-09-09 DIAGNOSIS — Z1231 Encounter for screening mammogram for malignant neoplasm of breast: Secondary | ICD-10-CM

## 2023-09-16 DIAGNOSIS — J3089 Other allergic rhinitis: Secondary | ICD-10-CM | POA: Diagnosis not present

## 2023-09-29 DIAGNOSIS — J3089 Other allergic rhinitis: Secondary | ICD-10-CM | POA: Diagnosis not present

## 2023-10-13 DIAGNOSIS — J3089 Other allergic rhinitis: Secondary | ICD-10-CM | POA: Diagnosis not present

## 2023-10-27 DIAGNOSIS — J3089 Other allergic rhinitis: Secondary | ICD-10-CM | POA: Diagnosis not present

## 2023-10-28 ENCOUNTER — Other Ambulatory Visit: Payer: Self-pay | Admitting: Medical Genetics

## 2023-11-04 ENCOUNTER — Ambulatory Visit (INDEPENDENT_AMBULATORY_CARE_PROVIDER_SITE_OTHER)

## 2023-11-04 DIAGNOSIS — Z1231 Encounter for screening mammogram for malignant neoplasm of breast: Secondary | ICD-10-CM | POA: Diagnosis not present

## 2023-11-10 DIAGNOSIS — J3089 Other allergic rhinitis: Secondary | ICD-10-CM | POA: Diagnosis not present

## 2023-11-15 ENCOUNTER — Other Ambulatory Visit (HOSPITAL_COMMUNITY)
Admission: RE | Admit: 2023-11-15 | Discharge: 2023-11-15 | Disposition: A | Discharge status: Home / Self Care | Payer: Self-pay | Source: Ambulatory Visit | Attending: Medical Genetics | Admitting: Medical Genetics

## 2023-11-24 DIAGNOSIS — J3089 Other allergic rhinitis: Secondary | ICD-10-CM | POA: Diagnosis not present

## 2023-12-10 DIAGNOSIS — L237 Allergic contact dermatitis due to plants, except food: Secondary | ICD-10-CM | POA: Diagnosis not present

## 2023-12-15 DIAGNOSIS — J3089 Other allergic rhinitis: Secondary | ICD-10-CM | POA: Diagnosis not present

## 2023-12-29 DIAGNOSIS — J3089 Other allergic rhinitis: Secondary | ICD-10-CM | POA: Diagnosis not present

## 2024-01-04 DIAGNOSIS — J3089 Other allergic rhinitis: Secondary | ICD-10-CM | POA: Diagnosis not present

## 2024-01-12 DIAGNOSIS — J3089 Other allergic rhinitis: Secondary | ICD-10-CM | POA: Diagnosis not present

## 2024-01-18 DIAGNOSIS — Z08 Encounter for follow-up examination after completed treatment for malignant neoplasm: Secondary | ICD-10-CM | POA: Diagnosis not present

## 2024-01-18 DIAGNOSIS — Z85828 Personal history of other malignant neoplasm of skin: Secondary | ICD-10-CM | POA: Diagnosis not present

## 2024-01-18 DIAGNOSIS — D485 Neoplasm of uncertain behavior of skin: Secondary | ICD-10-CM | POA: Diagnosis not present

## 2024-01-18 DIAGNOSIS — L820 Inflamed seborrheic keratosis: Secondary | ICD-10-CM | POA: Diagnosis not present

## 2024-01-18 DIAGNOSIS — Z129 Encounter for screening for malignant neoplasm, site unspecified: Secondary | ICD-10-CM | POA: Diagnosis not present

## 2024-01-18 DIAGNOSIS — L821 Other seborrheic keratosis: Secondary | ICD-10-CM | POA: Diagnosis not present

## 2024-02-09 ENCOUNTER — Ambulatory Visit (INDEPENDENT_AMBULATORY_CARE_PROVIDER_SITE_OTHER): Payer: Medicare PPO | Admitting: Family Medicine

## 2024-02-09 VITALS — BP 101/62 | HR 70 | Ht 67.0 in | Wt 181.0 lb

## 2024-02-09 DIAGNOSIS — Z0000 Encounter for general adult medical examination without abnormal findings: Secondary | ICD-10-CM

## 2024-02-09 DIAGNOSIS — E039 Hypothyroidism, unspecified: Secondary | ICD-10-CM | POA: Diagnosis not present

## 2024-02-09 DIAGNOSIS — Z13220 Encounter for screening for lipoid disorders: Secondary | ICD-10-CM | POA: Diagnosis not present

## 2024-02-09 NOTE — Patient Instructions (Signed)
 Preventive Care 83 Years and Older, Female Preventive care refers to lifestyle choices and visits with your health care provider that can promote health and wellness. Preventive care visits are also called wellness exams. What can I expect for my preventive care visit? Counseling Your health care provider may ask you questions about your: Medical history, including: Past medical problems. Family medical history. Pregnancy and menstrual history. History of falls. Current health, including: Memory and ability to understand (cognition). Emotional well-being. Home life and relationship well-being. Sexual activity and sexual health. Lifestyle, including: Alcohol, nicotine or tobacco, and drug use. Access to firearms. Diet, exercise, and sleep habits. Work and work Astronomer. Sunscreen use. Safety issues such as seatbelt and bike helmet use. Physical exam Your health care provider will check your: Height and weight. These may be used to calculate your BMI (body mass index). BMI is a measurement that tells if you are at a healthy weight. Waist circumference. This measures the distance around your waistline. This measurement also tells if you are at a healthy weight and may help predict your risk of certain diseases, such as type 2 diabetes and high blood pressure. Heart rate and blood pressure. Body temperature. Skin for abnormal spots. What immunizations do I need?  Vaccines are usually given at various ages, according to a schedule. Your health care provider will recommend vaccines for you based on your age, medical history, and lifestyle or other factors, such as travel or where you work. What tests do I need? Screening Your health care provider may recommend screening tests for certain conditions. This may include: Lipid and cholesterol levels. Hepatitis C test. Hepatitis B test. HIV (human immunodeficiency virus) test. STI (sexually transmitted infection) testing, if you are at  risk. Lung cancer screening. Colorectal cancer screening. Diabetes screening. This is done by checking your blood sugar (glucose) after you have not eaten for a while (fasting). Mammogram. Talk with your health care provider about how often you should have regular mammograms. BRCA-related cancer screening. This may be done if you have a family history of breast, ovarian, tubal, or peritoneal cancers. Bone density scan. This is done to screen for osteoporosis. Talk with your health care provider about your test results, treatment options, and if necessary, the need for more tests. Follow these instructions at home: Eating and drinking  Eat a diet that includes fresh fruits and vegetables, whole grains, lean protein, and low-fat dairy products. Limit your intake of foods with high amounts of sugar, saturated fats, and salt. Take vitamin and mineral supplements as recommended by your health care provider. Do not drink alcohol if your health care provider tells you not to drink. If you drink alcohol: Limit how much you have to 0-1 drink a day. Know how much alcohol is in your drink. In the [place] one drink equals one 12 oz bottle of beer (355 mL), one 5 oz glass of wine (148 mL), or one 1 oz glass of hard liquor (44 mL). Lifestyle Brush your teeth every morning and night with fluoride toothpaste. Floss one time each day. Exercise for at least 30 minutes 5 or more days each week. Do not use any products that contain nicotine or tobacco. These products include cigarettes, chewing tobacco, and vaping devices, such as e-cigarettes. If you need help quitting, ask your health care provider. Do not use drugs. If you are sexually active, practice safe sex. Use a condom or other form of protection in order to prevent STIs. Take aspirin only as told by  your health care provider. Make sure that you understand how much to take and what form to take. Work with your health care provider to find out whether it  is safe and beneficial for you to take aspirin daily. Ask your health care provider if you need to take a cholesterol-lowering medicine (statin). Find healthy ways to manage stress, such as: Meditation, yoga, or listening to music. Journaling. Talking to a trusted person. Spending time with friends and family. Minimize exposure to UV radiation to reduce your risk of skin cancer. Safety Always wear your seat belt while driving or riding in a vehicle. Do not drive: If you have been drinking alcohol. Do not ride with someone who has been drinking. When you are tired or distracted. While texting. If you have been using any mind-altering substances or drugs. Wear a helmet and other protective equipment during sports activities. If you have firearms in your house, make sure you follow all gun safety procedures. What's next? Visit your health care provider once a year for an annual wellness visit. Ask your health care provider how often you should have your eyes and teeth checked. Stay up to date on all vaccines. This information is not intended to replace advice given to you by your health care provider. Make sure you discuss any questions you have with your health care provider. Document Revised: 11/27/2020 Document Reviewed: 11/27/2020 Elsevier Patient Education  2024 [employer].

## 2024-02-09 NOTE — Assessment & Plan Note (Signed)
Update TSH today.

## 2024-02-09 NOTE — Assessment & Plan Note (Signed)
 Well adult Orders Placed This Encounter  Procedures   CMP14+EGFR   CBC with Differential/Platelet   Lipid Panel With LDL/HDL Ratio   TSH  Screenings: per lab orders.   Immunizations:  UTD Anticipatory guidance/Risk factor reduction:  Recommendations per AVS.

## 2024-02-09 NOTE — Progress Notes (Signed)
 Danielle Galvan - 68 y.o. female MRN [id number]  Date of birth: August 26, 1955  Subjective Chief Complaint  Patient presents with   Annual Exam    HPI Danielle Galvan is a 68 y.o. female here today for annual exam.   She reports that she is doing well.   She is moderately active.  She feels that her diet is pretty good.    She is a non-smoker.  Denies EtOH use.   She has regular dental care.   Review of Systems  Constitutional:  Negative for chills, fever, malaise/fatigue and weight loss.  HENT:  Negative for congestion, ear pain and sore throat.   Eyes:  Negative for blurred vision, double vision and pain.  Respiratory:  Negative for cough and shortness of breath.   Cardiovascular:  Negative for chest pain and palpitations.  Gastrointestinal:  Negative for abdominal pain, blood in stool, constipation, heartburn and nausea.  Genitourinary:  Negative for dysuria and urgency.  Musculoskeletal:  Negative for joint pain and myalgias.  Neurological:  Negative for dizziness and headaches.  Endo/Heme/Allergies:  Does not bruise/bleed easily.  Psychiatric/Behavioral:  Negative for depression. The patient is not nervous/anxious and does not have insomnia.     Allergies  Allergen Reactions   Sulfa Antibiotics Hives and Rash   Ciprofloxacin    Ivy Leaf [Hedera Helix]     Past Medical History:  Diagnosis Date   Allergy    see file   Hypertension 01/2023   Not a diagnosis; precaution   Hypothyroid 06/19/2014   Shingles 11/2013    Past Surgical History:  Procedure Laterality Date   APPENDECTOMY  2010   COLONOSCOPY  2019    Social History   Socioeconomic History   Marital status: Married    Spouse name: Tishina Lown   Number of children: 4   Years of education: 24   Highest education level: Professional school degree (e.g., MD, DDS, DVM, JD)  Occupational History   Occupation: Retired  Tobacco Use   Smoking status: Never   Smokeless tobacco: Never  Vaping Use   Vaping  status: Never Used  Substance and Sexual Activity   Alcohol use: No   Drug use: No   Sexual activity: Not Currently    Partners: Male    Birth control/protection: None  Other Topics Concern   Not on file  Social History Narrative   Lives with her husband. She enjoys RV camping, bicycling, gardening and hiking.    Social Drivers of Corporate investment banker Strain: Low Risk  (02/09/2024)   Overall Financial Resource Strain (CARDIA)    Difficulty of Paying Living Expenses: Not hard at all  Food Insecurity: No Food Insecurity (02/09/2024)   Hunger Vital Sign    Worried About Running Out of Food in the Last Year: Never true    Ran Out of Food in the Last Year: Never true  Transportation Galvan: No Transportation Galvan (02/09/2024)   PRAPARE - Administrator, Civil Service (Medical): No    Lack of Transportation (Non-Medical): No  Physical Activity: Sufficiently Active (02/09/2024)   Exercise Vital Sign    Days of Exercise per Week: 6 days    Minutes of Exercise per Session: 50 min  Stress: No Stress Concern Present (02/09/2024)   [employer] of Occupational Health - Occupational Stress Questionnaire    Feeling of Stress: Not at all  Social Connections: Socially Integrated (02/09/2024)   Social Connection and Isolation Panel    Frequency of Communication  with Friends and Family: Never    Frequency of Social Gatherings with Friends and Family: Three times a week    Attends Religious Services: More than 4 times per year    Active Member of Clubs or Organizations: Yes    Attends Engineer, structural: More than 4 times per year    Marital Status: Married    Family History  Problem Relation Age of Onset   Varicose Veins Mother    Prostate cancer Father    Dementia Father    Alcohol abuse Brother    Cancer Brother    Obesity Brother    Kidney disease Brother    Obesity Brother    Obesity Brother    Diabetes Maternal Uncle    Early death Maternal  Grandmother    Colon cancer Paternal Grandfather 61   Colon polyps Paternal Grandfather 6   Birth defects Daughter    Birth defects Son    Rectal cancer Neg Hx    Stomach cancer Neg Hx     Health Maintenance  Topic Date Due   Hepatitis C Screening  Never done   COVID-19 Vaccine (6 - 2024-25 season) 02/14/2023   INFLUENZA VACCINE  01/14/2024   Medicare Annual Wellness (AWV)  04/11/2024   MAMMOGRAM  11/03/2024   DTaP/Tdap/Td (3 - Td or Tdap) 04/22/2025   DEXA SCAN  07/16/2027   Colonoscopy  05/03/2028   Pneumococcal Vaccine: 50+ Years  Completed   Zoster Vaccines- Shingrix  Completed   HPV VACCINES  Aged Out   Meningococcal B Vaccine  Aged Out   Hepatitis B Vaccines 19-59 Average Risk  Discontinued     ----------------------------------------------------------------------------------------------------------------------------------------------------------------------------------------------------------------- Physical Exam BP 101/62 (BP Location: Left Arm, Patient Position: Sitting, Cuff Size: Normal)   Pulse 70   Ht 5' 7 (1.702 m)   Wt 181 lb (82.1 kg)   SpO2 96%   BMI 28.35 kg/m   Physical Exam Constitutional:      General: She is not in acute distress. HENT:     Head: Normocephalic and atraumatic.     Right Ear: Tympanic membrane and ear canal normal.     Left Ear: Tympanic membrane and ear canal normal.     Nose: Nose normal.  Eyes:     General: No scleral icterus.    Conjunctiva/sclera: Conjunctivae normal.  Neck:     Thyroid : No thyromegaly.  Cardiovascular:     Rate and Rhythm: Normal rate and regular rhythm.     Heart sounds: Normal heart sounds.  Pulmonary:     Effort: Pulmonary effort is normal.     Breath sounds: Normal breath sounds.  Abdominal:     General: Bowel sounds are normal. There is no distension.     Palpations: Abdomen is soft.     Tenderness: There is no abdominal tenderness. There is no guarding.  Musculoskeletal:        General:  Normal range of motion.     Cervical back: Normal range of motion and neck supple.  Lymphadenopathy:     Cervical: No cervical adenopathy.  Skin:    General: Skin is warm and dry.     Findings: No rash.  Neurological:     General: No focal deficit present.     Mental Status: She is alert and oriented to person, place, and time.     Cranial Nerves: No cranial nerve deficit.     Coordination: Coordination normal.  Psychiatric:        Mood and  Affect: Mood normal.        Behavior: Behavior normal.     ------------------------------------------------------------------------------------------------------------------------------------------------------------------------------------------------------------------- Assessment and Plan  Well adult exam Well adult Orders Placed This Encounter  Procedures   CMP14+EGFR   CBC with Differential/Platelet   Lipid Panel With LDL/HDL Ratio   TSH  Screenings: per lab orders.   Immunizations:  UTD Anticipatory guidance/Risk factor reduction:  Recommendations per AVS.   Hypothyroid Update TSH today.    No orders of the defined types were placed in this encounter.   No follow-ups on file.

## 2024-02-10 LAB — LIPID PANEL WITH LDL/HDL RATIO
Cholesterol, Total: 196 mg/dL (ref 100–199)
HDL: 68 mg/dL (ref >39)
LDL Chol Calc (NIH): 100 mg/dL — ABNORMAL HIGH (ref 0–99)
LDL/HDL Ratio: 1.5 ratio (ref 0.0–3.2)
Triglycerides: 163 mg/dL — ABNORMAL HIGH (ref 0–149)
VLDL Cholesterol Cal: 28 mg/dL (ref 5–40)

## 2024-02-10 LAB — CBC WITH DIFFERENTIAL/PLATELET
Basophils Absolute: 0.1 x10E3/uL (ref 0.0–0.2)
Basos: 1 %
EOS (ABSOLUTE): 0.1 x10E3/uL (ref 0.0–0.4)
Eos: 1 %
Hematocrit: 43.1 % (ref 34.0–46.6)
Hemoglobin: 14.2 g/dL (ref 11.1–15.9)
Immature Grans (Abs): 0 x10E3/uL (ref 0.0–0.1)
Immature Granulocytes: 0 %
Lymphocytes Absolute: 1.3 x10E3/uL (ref 0.7–3.1)
Lymphs: 25 %
MCH: 30.1 pg (ref 26.6–33.0)
MCHC: 32.9 g/dL (ref 31.5–35.7)
MCV: 91 fL (ref 79–97)
Monocytes Absolute: 0.5 x10E3/uL (ref 0.1–0.9)
Monocytes: 10 %
Neutrophils Absolute: 3.1 x10E3/uL (ref 1.4–7.0)
Neutrophils: 63 %
Platelets: 255 x10E3/uL (ref 150–450)
RBC: 4.72 x10E6/uL (ref 3.77–5.28)
RDW: 13 % (ref 11.7–15.4)
WBC: 5 x10E3/uL (ref 3.4–10.8)

## 2024-02-10 LAB — CMP14+EGFR
ALT: 17 IU/L (ref 0–32)
AST: 16 IU/L (ref 0–40)
Albumin: 4.3 g/dL (ref 3.9–4.9)
Alkaline Phosphatase: 79 IU/L (ref 44–121)
BUN/Creatinine Ratio: 18 (ref 12–28)
BUN: 19 mg/dL (ref 8–27)
Bilirubin Total: 0.3 mg/dL (ref 0.0–1.2)
CO2: 22 mmol/L (ref 20–29)
Calcium: 9.4 mg/dL (ref 8.7–10.3)
Chloride: 101 mmol/L (ref 96–106)
Creatinine, Ser: 1.07 mg/dL — ABNORMAL HIGH (ref 0.57–1.00)
Globulin, Total: 2.7 g/dL (ref 1.5–4.5)
Glucose: 78 mg/dL (ref 70–99)
Potassium: 4.5 mmol/L (ref 3.5–5.2)
Sodium: 139 mmol/L (ref 134–144)
Total Protein: 7 g/dL (ref 6.0–8.5)
eGFR: 56 mL/min/1.73 — ABNORMAL LOW (ref >59)

## 2024-02-10 LAB — TSH: TSH: 1.66 u[IU]/mL (ref 0.450–4.500)

## 2024-02-16 DIAGNOSIS — J3089 Other allergic rhinitis: Secondary | ICD-10-CM | POA: Diagnosis not present

## 2024-02-20 ENCOUNTER — Ambulatory Visit: Payer: Self-pay | Admitting: Family Medicine

## 2024-03-01 DIAGNOSIS — J3089 Other allergic rhinitis: Secondary | ICD-10-CM | POA: Diagnosis not present

## 2024-03-13 ENCOUNTER — Telehealth: Payer: Self-pay | Admitting: Family Medicine

## 2024-03-13 NOTE — Telephone Encounter (Signed)
 Copied from CRM [id number]. Topic: Clinical - Prescription Issue >> Mar 13, 2024  3:19 PM Willma R wrote: Reason for CRM: Patients prescription for levothyroxine  (SYNTHROID ) 100 MCG tablet [facility]. [id number] [place], [place] - [address] [place] [address] [place] [address] [place]  [address] Phone: [phone] Fax: [phone]  Patient can be reached at [phone]

## 2024-03-14 ENCOUNTER — Other Ambulatory Visit: Payer: Self-pay

## 2024-03-14 MED ORDER — LEVOTHYROXINE SODIUM 100 MCG PO TABS: 100.0000 ug | ORAL_TABLET | Freq: Every day | ORAL | 3 refills | Status: AC

## 2024-03-14 NOTE — Telephone Encounter (Signed)
 Medication refill has been sent to patients preferred pharmacy.   [id number] - [place], [place] - [address] [place] [address] [place] [address] [place] [address] Phone: [phone] Fax: [phone]   Patient has been advised.

## 2024-03-22 DIAGNOSIS — J3089 Other allergic rhinitis: Secondary | ICD-10-CM | POA: Diagnosis not present

## 2024-04-03 DIAGNOSIS — J3089 Other allergic rhinitis: Secondary | ICD-10-CM | POA: Diagnosis not present

## 2024-04-17 DIAGNOSIS — J3089 Other allergic rhinitis: Secondary | ICD-10-CM | POA: Diagnosis not present

## 2024-04-20 ENCOUNTER — Ambulatory Visit (INDEPENDENT_AMBULATORY_CARE_PROVIDER_SITE_OTHER)

## 2024-04-20 VITALS — BP 126/64 | HR 65 | Ht 67.0 in | Wt 184.0 lb

## 2024-04-20 DIAGNOSIS — Z0000 Encounter for general adult medical examination without abnormal findings: Secondary | ICD-10-CM

## 2024-04-20 NOTE — Progress Notes (Signed)
 Subjective:   Danielle Galvan is a 68 y.o. female who presents for a Medicare Annual Wellness Visit.  Allergies (verified) Sulfa antibiotics, Ciprofloxacin, and Ivy leaf [hedera helix]   History: Past Medical History:  Diagnosis Date   Allergy    see file   Hypertension 01/2023   Not a diagnosis; precaution   Hypothyroid 06/19/2014   Shingles 11/2013   Past Surgical History:  Procedure Laterality Date   APPENDECTOMY  2010   COLONOSCOPY  2019   Family History  Problem Relation Age of Onset   Varicose Veins Mother    Prostate cancer Father    Dementia Father    Alcohol abuse Brother    Cancer Brother    Obesity Brother    Kidney disease Brother    Obesity Brother    Obesity Brother    Diabetes Maternal Uncle    Early death Maternal Grandmother    Colon cancer Paternal Grandfather 58   Colon polyps Paternal Grandfather 52   Birth defects Daughter    Birth defects Son    Rectal cancer Neg Hx    Stomach cancer Neg Hx    Social History   Occupational History   Occupation: Retired  Tobacco Use   Smoking status: Never   Smokeless tobacco: Never  Vaping Use   Vaping status: Never Used  Substance and Sexual Activity   Alcohol use: No   Drug use: No   Sexual activity: Not Currently    Partners: Male    Birth control/protection: None   Tobacco Counseling Counseling given: Not Answered  SDOH Screenings   Food Insecurity: No Food Insecurity (04/20/2024)  Housing: Low Risk  (04/20/2024)  Transportation Needs: No Transportation Needs (04/20/2024)  Utilities: Not At Risk (04/20/2024)  Alcohol Screen: Low Risk  (04/12/2023)  Depression (PHQ2-9): Low Risk  (04/20/2024)  Financial Resource Strain: Low Risk  (04/20/2024)  Physical Activity: Inactive (04/20/2024)  Social Connections: Socially Integrated (04/20/2024)  Stress: No Stress Concern Present (04/20/2024)  Tobacco Use: Low Risk  (04/20/2024)  Health Literacy: Adequate Health Literacy (04/20/2024)   Depression  Screen    04/20/2024    1:23 PM 02/09/2024    2:34 PM 04/12/2023    1:02 PM 04/06/2022    1:07 PM 05/07/2021    2:11 PM 01/07/2021    8:28 AM 12/19/2019    1:32 PM  PHQ 2/9 Scores  PHQ - 2 Score 0 0 0 0 0 0 1  PHQ- 9 Score  0      1      Data saved with a previous flowsheet row definition      Goals Addressed             This Visit's Progress    Patient Stated       Patient states she would like to start back exercising.        Visit info / Clinical Intake: Medicare Wellness Visit Type:: Subsequent Annual Wellness Visit Medicare Wellness Visit Mode:: In-person (required for WTM) Interpreter Needed?: No Pre-visit prep was completed: yes AWV questionnaire completed by patient prior to visit?: yes Date:: 04/20/24 Living arrangements:: lives with spouse/significant other Patient's Overall Health Status Rating: excellent Typical amount of pain: none Does pain affect daily life?: no Are you currently prescribed opioids?: no  Dietary Habits and Nutritional Risks How many meals a day?: 2 Eats fruit and vegetables daily?: yes Most meals are obtained by: preparing own meals Diabetic:: no  Functional Status Activities of Daily Living (to include  ambulation/medication): Independent Ambulation: Independent Medication Administration: Independent Home Management: Independent Manage your own finances?: yes Primary transportation is: driving Concerns about vision?: no *vision screening is required for WTM* Concerns about hearing?: no  Fall Screening Falls in the past year?: 0 Number of falls in past year: 0 Was there an injury with Fall?: 0 Fall Risk Category Calculator: 0 Patient Fall Risk Level: Low Fall Risk  Fall Risk Patient at Risk for Falls Due to: No Fall Risks Fall risk Follow up: Falls evaluation completed  Home and Transportation Safety: All rugs have non-skid backing?: yes All stairs or steps have railings?: (!) no Grab bars in the bathtub or shower?:  (!) no Have non-skid surface in bathtub or shower?: yes Good home lighting?: yes Regular seat belt use?: yes Hospital stays in the last year:: no  Cognitive Assessment Difficulty concentrating, remembering, or making decisions? : no Will 6CIT or Mini Cog be Completed: yes What year is it?: 0 points What month is it?: 0 points Give patient an address phrase to remember (5 components): [address] [place], [place] [address] About what time is it?: 0 points Count backwards from 20 to 1: 0 points Say the months of the year in reverse: 0 points Repeat the address phrase from earlier: 0 points 6 CIT Score: 0 points  Advance Directives (For Healthcare) Does Patient Have a Medical Advance Directive?: Yes Does patient want to make changes to medical advance directive?: No - Patient declined Type of Advance Directive: Living will; Healthcare Power of Attorney Copy of Healthcare Power of Attorney in Chart?: No - copy requested Copy of Living Will in Chart?: No - copy requested  Reviewed/Updated  Reviewed/Updated: Medical History; Surgical History; Medications; Allergies; Care Teams; Patient Goals        Objective:    Today's Vitals   04/20/24 1304  BP: 126/64  Pulse: 65  SpO2: 100%  Weight: 184 lb (83.5 kg)  Height: 5' 7 (1.702 m)   Body mass index is 28.82 kg/m.  Current Medications (verified) Outpatient Encounter Medications as of 04/20/2024  Medication Sig   Clobetasol  Prop Emollient Base 0.05 % emollient cream    levothyroxine  (SYNTHROID ) 100 MCG tablet Take 1 tablet (100 mcg total) by mouth daily before breakfast.   Tretinoin 0.05 % LOTN Apply 1 application  topically 3 (three) times a week.   Facility-Administered Encounter Medications as of 04/20/2024  Medication   0.9 %  sodium chloride  infusion   Hearing/Vision screen No results found. Immunizations and Health Maintenance Health Maintenance  Topic Date Due   Hepatitis C Screening  Never done   Influenza Vaccine   01/14/2024   COVID-19 Vaccine (6 - 2025-26 season) 02/14/2024   Mammogram  11/03/2024   Medicare Annual Wellness (AWV)  04/20/2025   DTaP/Tdap/Td (3 - Td or Tdap) 04/22/2025   DEXA SCAN  07/16/2027   Colonoscopy  05/03/2030   Pneumococcal Vaccine: 50+ Years  Completed   Zoster Vaccines- Shingrix  Completed   Meningococcal B Vaccine  Aged Out   Hepatitis B Vaccines 19-59 Average Risk  Discontinued        Assessment/Plan:  This is a routine wellness examination for Danielle Galvan.  Patient Care Team: Alvia Bring, DO as PCP - General (Family Medicine) Hollar, Lahoma Greener, MD as Referring Physician (Dermatology) Stacia Glendia BRAVO, MD as Consulting Physician (Gastroenterology) Oneita Tresea DEL, OD (Optometry)  I have personally reviewed and noted the following in the patient's chart:   Medical and social history Use of alcohol,  tobacco or illicit drugs  Current medications and supplements including opioid prescriptions. Functional ability and status Nutritional status Physical activity Advanced directives List of other physicians Hospitalizations, surgeries, and ER visits in previous 12 months Vitals Screenings to include cognitive, depression, and falls Referrals and appointments  No orders of the defined types were placed in this encounter.  In addition, I have reviewed and discussed with patient certain preventive protocols, quality metrics, and best practice recommendations. A written personalized care plan for preventive services as well as general preventive health recommendations were provided to patient.   Danielle Galvan, CMA   04/20/2024   Return in 1 year (on 04/20/2025).  After Visit Summary: (In Person-Declined) Patient declined AVS at this time.  Nurse Notes:   Danielle Galvan is a 68 y.o. female patient of Alvia Bring, DO who had a Medicare Annual Wellness Visit today via telephone. Danielle Galvan is Retired and lives with their spouse. She has 4 children. She  reports that she is socially active and does interact with friends/family regularly. She is moderately physically active and enjoys RV camping, bicycling, gardening and hiking.

## 2024-04-20 NOTE — Patient Instructions (Signed)
 Danielle Galvan,  Thank you for taking the time for your Medicare Wellness Visit. I appreciate your continued commitment to your health goals. Please review the care plan we discussed, and feel free to reach out if I can assist you further.  Please note that Annual Wellness Visits do not include a physical exam. Some assessments may be limited, especially if the visit was conducted virtually. If needed, we may recommend an in-person follow-up with your provider.  Ongoing Care Seeing your primary care provider every 3 to 6 months helps us  monitor your health and provide consistent, personalized care.   Referrals If a referral was made during today's visit and you haven't received any updates within two weeks, please contact the referred provider directly to check on the status.  Recommended Screenings:  Health Maintenance  Topic Date Due   Hepatitis C Screening  Never done   Flu Shot  01/14/2024   COVID-19 Vaccine (6 - 2025-26 season) 02/14/2024   Medicare Annual Wellness Visit  04/11/2024   Breast Cancer Screening  11/03/2024   DTaP/Tdap/Td vaccine (3 - Td or Tdap) 04/22/2025   DEXA scan (bone density measurement)  07/16/2027   Colon Cancer Screening  05/03/2030   Pneumococcal Vaccine for age over 6  Completed   Zoster (Shingles) Vaccine  Completed   Meningitis B Vaccine  Aged Out   Hepatitis B Vaccine  Discontinued       04/20/2024    1:22 PM  Advanced Directives  Does Patient Have a Medical Advance Directive? Yes  Type of Advance Directive Living will;Healthcare Power of Attorney  Does patient want to make changes to medical advance directive? No - Patient declined  Copy of Healthcare Power of Attorney in Chart? No - copy requested    Vision: Annual vision screenings are recommended for early detection of glaucoma, cataracts, and diabetic retinopathy. These exams can also reveal signs of chronic conditions such as diabetes and high blood pressure.  Dental: Annual dental  screenings help detect early signs of oral cancer, gum disease, and other conditions linked to overall health, including heart disease and diabetes.

## 2024-05-01 DIAGNOSIS — J3089 Other allergic rhinitis: Secondary | ICD-10-CM | POA: Diagnosis not present

## 2024-05-15 DIAGNOSIS — J3089 Other allergic rhinitis: Secondary | ICD-10-CM | POA: Diagnosis not present

## 2025-02-14 ENCOUNTER — Encounter: Admitting: Family Medicine

## 2025-05-01 ENCOUNTER — Ambulatory Visit
